# Patient Record
Sex: Female | Born: 1995 | Race: Black or African American | Hispanic: No | Marital: Single | Smoking: Never smoker
Health system: Southern US, Community
[De-identification: ages and names within clinical notes are randomized; demographics above are authoritative.]

## PROBLEM LIST (undated history)

## (undated) DIAGNOSIS — B009 Herpesviral infection, unspecified: Secondary | ICD-10-CM

## (undated) DIAGNOSIS — I10 Essential (primary) hypertension: Secondary | ICD-10-CM

## (undated) DIAGNOSIS — A64 Unspecified sexually transmitted disease: Secondary | ICD-10-CM

## (undated) HISTORY — PX: WISDOM TOOTH EXTRACTION: SHX21

## (undated) HISTORY — PX: TONSILLECTOMY: SUR1361

## (undated) HISTORY — DX: Herpesviral infection, unspecified: B00.9

## (undated) HISTORY — DX: Unspecified sexually transmitted disease: A64

## (undated) HISTORY — DX: Essential (primary) hypertension: I10

---

## 2013-08-08 ENCOUNTER — Encounter (HOSPITAL_COMMUNITY): Payer: Self-pay | Admitting: Emergency Medicine

## 2013-08-08 ENCOUNTER — Emergency Department (HOSPITAL_COMMUNITY)
Admission: EM | Admit: 2013-08-08 | Discharge: 2013-08-09 | Disposition: A | Discharge status: Home / Self Care | Payer: Medicaid Other | Attending: Emergency Medicine | Admitting: Emergency Medicine

## 2013-08-08 DIAGNOSIS — R102 Pelvic and perineal pain: Secondary | ICD-10-CM

## 2013-08-08 DIAGNOSIS — F17200 Nicotine dependence, unspecified, uncomplicated: Secondary | ICD-10-CM | POA: Insufficient documentation

## 2013-08-08 DIAGNOSIS — N949 Unspecified condition associated with female genital organs and menstrual cycle: Secondary | ICD-10-CM | POA: Insufficient documentation

## 2013-08-08 DIAGNOSIS — Z3202 Encounter for pregnancy test, result negative: Secondary | ICD-10-CM | POA: Insufficient documentation

## 2013-08-08 LAB — POCT PREGNANCY, URINE: Preg Test, Ur: NEGATIVE

## 2013-08-08 NOTE — ED Notes (Signed)
Pt states she woke up this am with severe abd pain she describes as cramps, she also states that she has a rash on her chest and a blister on her upper lip that she noticed a few days ago

## 2013-08-09 ENCOUNTER — Emergency Department (HOSPITAL_COMMUNITY): Payer: Medicaid Other

## 2013-08-09 LAB — COMPREHENSIVE METABOLIC PANEL
ALT: 12 U/L (ref 0–35)
Albumin: 4.1 g/dL (ref 3.5–5.2)
Alkaline Phosphatase: 66 U/L (ref 39–117)
BUN: 11 mg/dL (ref 6–23)
Chloride: 101 mEq/L (ref 96–112)
Creatinine, Ser: 0.76 mg/dL (ref 0.50–1.10)
GFR calc Af Amer: >90 mL/min (ref >90)
GFR calc non Af Amer: >90 mL/min (ref >90)
Glucose, Bld: 86 mg/dL (ref 70–99)
Potassium: 3.7 mEq/L (ref 3.5–5.1)
Sodium: 136 mEq/L (ref 135–145)
Total Bilirubin: 0.2 mg/dL — ABNORMAL LOW (ref 0.3–1.2)
Total Protein: 7.8 g/dL (ref 6.0–8.3)

## 2013-08-09 LAB — URINALYSIS, ROUTINE W REFLEX MICROSCOPIC
Bilirubin Urine: NEGATIVE
Hgb urine dipstick: NEGATIVE
Ketones, ur: NEGATIVE mg/dL
Leukocytes, UA: NEGATIVE
Nitrite: NEGATIVE
Protein, ur: 30 mg/dL — AB
Specific Gravity, Urine: 1.033 — ABNORMAL HIGH (ref 1.005–1.030)
pH: 5.5 (ref 5.0–8.0)

## 2013-08-09 LAB — CBC WITH DIFFERENTIAL/PLATELET
Eosinophils Absolute: 0.2 10*3/uL (ref 0.0–0.7)
Hemoglobin: 11.8 g/dL — ABNORMAL LOW (ref 12.0–15.0)
Lymphs Abs: 2.3 10*3/uL (ref 0.7–4.0)
MCH: 25.7 pg — ABNORMAL LOW (ref 26.0–34.0)
Monocytes Relative: 8 % (ref 3–12)
Neutro Abs: 7.1 10*3/uL (ref 1.7–7.7)
Neutrophils Relative %: 68 % (ref 43–77)
Platelets: 306 10*3/uL (ref 150–400)
RBC: 4.59 MIL/uL (ref 3.87–5.11)
RDW: 14.3 % (ref 11.5–15.5)
WBC: 10.4 10*3/uL (ref 4.0–10.5)

## 2013-08-09 LAB — WET PREP, GENITAL
Trich, Wet Prep: NONE SEEN
WBC, Wet Prep HPF POC: NONE SEEN
Yeast Wet Prep HPF POC: NONE SEEN

## 2013-08-09 LAB — LIPASE, BLOOD: Lipase: 29 U/L (ref 11–59)

## 2013-08-09 MED ORDER — OXYCODONE-ACETAMINOPHEN 5-325 MG PO TABS
2.0000 | ORAL_TABLET | Freq: Once | ORAL | Status: AC
  Administered 2013-08-09: 2 via ORAL
  Filled 2013-08-09: qty 2

## 2013-08-09 MED ORDER — DICYCLOMINE HCL 20 MG PO TABS
20.0000 mg | ORAL_TABLET | Freq: Once | ORAL | Status: AC
  Administered 2013-08-09: 20 mg via ORAL
  Filled 2013-08-09: qty 1

## 2013-08-09 MED ORDER — OXYCODONE-ACETAMINOPHEN 5-325 MG PO TABS: 2.0000 | ORAL_TABLET | ORAL | Status: DC | PRN

## 2013-08-09 MED ORDER — NAPROXEN 500 MG PO TABS: 500.0000 mg | ORAL_TABLET | Freq: Two times a day (BID) | ORAL | Status: DC

## 2013-08-09 NOTE — ED Provider Notes (Signed)
CSN: [id number]     Arrival date & time 08/08/13  2136 History   First MD Initiated Contact with Patient 08/08/13 2356     Chief Complaint  Patient presents with  . Abdominal Pain   (Consider location/radiation/quality/duration/timing/severity/associated sxs/prior Treatment) HPI 17 year old female presents to emergency room with severe lower abdominal pain described as cramping in nature.  She denies any fever, chills, no urinary symptoms, no new sexual partners.  No nausea or vomiting.  Her last menstrual period ended 3 days ago.  Patient had normal bowel movement this morning.  Patient took Midol and Gas-X.  This morning, which helped slightly.  She reports similar presentation of pain earlier in the year, also, right after her menses.  She's never had a pelvic exam before.  She is sexually active.  History reviewed. No pertinent past medical history. History reviewed. No pertinent past surgical history. History reviewed. No pertinent family history. History  Substance Use Topics  . Smoking status: Current Every Day Smoker  . Smokeless tobacco: Not on file  . Alcohol Use: Yes   OB History   Grav Para Term Preterm Abortions TAB SAB Ect Mult Living                 Review of Systems  See History of Present Illness; otherwise all other systems are reviewed and negative  Allergies  Review of patient's allergies indicates no known allergies.  Home Medications  No current outpatient prescriptions on file. BP 144/74  Pulse 68  Temp(Src) 98 F (36.7 C) (Oral)  Resp 16  SpO2 100%  LMP 08/06/2013 Physical Exam  Nursing note and vitals reviewed. Constitutional: She is oriented to person, place, and time. She appears well-developed and well-nourished.  HENT:  Head: Normocephalic and atraumatic.  Nose: Nose normal.  Mouth/Throat: Oropharynx is clear and moist.  Eyes: Conjunctivae and EOM are normal. Pupils are equal, round, and reactive to light.  Neck: Normal range of motion.  Neck supple. No JVD present. No tracheal deviation present. No thyromegaly present.  Cardiovascular: Normal rate, regular rhythm, normal heart sounds and intact distal pulses.  Exam reveals no gallop and no friction rub.   No murmur heard. Pulmonary/Chest: Effort normal and breath sounds normal. No stridor. No respiratory distress. She has no wheezes. She has no rales. She exhibits no tenderness.  Abdominal: Soft. Bowel sounds are normal. She exhibits no distension and no mass. There is tenderness (tenderness palpation throughout the lower abdomen/pelvic area). There is no rebound and no guarding.  Genitourinary:  External genitalia normal Vagina with slight white discharge Cervix closed/open friable no lesions No cervical motion tenderness Adnexa palpated, no masses , but bilateral adnexal tenderness, right greater than left Bladder palpated no tenderness Uterus palpated no masses or tenderness    Musculoskeletal: Normal range of motion. She exhibits no edema and no tenderness.  Lymphadenopathy:    She has no cervical adenopathy.  Neurological: She is alert and oriented to person, place, and time. She exhibits normal muscle tone. Coordination normal.  Skin: Skin is warm and dry. No rash noted. No erythema. No pallor.  Psychiatric: She has a normal mood and affect. Her behavior is normal. Judgment and thought content normal.    ED Course  Procedures (including critical care time) Labs Review Labs Reviewed  WET PREP, GENITAL - Abnormal; Notable for the following:    Clue Cells Wet Prep HPF POC MANY (*)    All other components within normal limits  CBC WITH DIFFERENTIAL - Abnormal;  Notable for the following:    Hemoglobin 11.8 (*)    MCH 25.7 (*)    All other components within normal limits  COMPREHENSIVE METABOLIC PANEL - Abnormal; Notable for the following:    Total Bilirubin 0.2 (*)    All other components within normal limits  URINALYSIS, ROUTINE W REFLEX MICROSCOPIC -  Abnormal; Notable for the following:    Specific Gravity, Urine 1.033 (*)    Protein, ur 30 (*)    All other components within normal limits  GC/CHLAMYDIA PROBE AMP  LIPASE, BLOOD  URINE MICROSCOPIC-ADD ON  POCT PREGNANCY, URINE   Imaging Review US Transvaginal Non-ob  08/09/2013   CLINICAL DATA:  Right lower quadrant abdominal pain.  EXAM: TRANSABDOMINAL AND TRANSVAGINAL ULTRASOUND OF PELVIS  DOPPLER ULTRASOUND OF OVARIES  TECHNIQUE: Both transabdominal and transvaginal ultrasound examinations of the pelvis were performed. Transabdominal technique was performed for global imaging of the pelvis including uterus, ovaries, adnexal regions, and pelvic cul-de-sac.  It was necessary to proceed with endovaginal exam following the transabdominal exam to visualize the uterus and ovaries in greater detail. Color and duplex Doppler ultrasound was utilized to evaluate blood flow to the ovaries.  COMPARISON:  None.  FINDINGS: Uterus  Measurements: 9.1 x 4.5 x 5.1 cm. No fibroids or other mass visualized.  Endometrium  Thickness: 0.1 cm in thickness.  No focal abnormality visualized.  Right ovary  Measurements: 3.6 x 1.8 x 1.7 cm. Normal appearance/no adnexal mass.  Left ovary  Measurements: 3.2 x 2.0 x 2.8 cm. Normal appearance/no adnexal mass.  Pulsed Doppler evaluation of both ovaries demonstrates normal low-resistance arterial and venous waveforms.  Other findings  Trace free fluid is seen within the pelvic cul-de-sac.  IMPRESSION: Unremarkable pelvic ultrasound.  No evidence for ovarian torsion.   Electronically Signed   By: Roanna Raider M.D.   On: 08/09/2013 03:04   US Pelvis Complete  08/09/2013   CLINICAL DATA:  Right lower quadrant abdominal pain.  EXAM: TRANSABDOMINAL AND TRANSVAGINAL ULTRASOUND OF PELVIS  DOPPLER ULTRASOUND OF OVARIES  TECHNIQUE: Both transabdominal and transvaginal ultrasound examinations of the pelvis were performed. Transabdominal technique was performed for global imaging of the  pelvis including uterus, ovaries, adnexal regions, and pelvic cul-de-sac.  It was necessary to proceed with endovaginal exam following the transabdominal exam to visualize the uterus and ovaries in greater detail. Color and duplex Doppler ultrasound was utilized to evaluate blood flow to the ovaries.  COMPARISON:  None.  FINDINGS: Uterus  Measurements: 9.1 x 4.5 x 5.1 cm. No fibroids or other mass visualized.  Endometrium  Thickness: 0.1 cm in thickness.  No focal abnormality visualized.  Right ovary  Measurements: 3.6 x 1.8 x 1.7 cm. Normal appearance/no adnexal mass.  Left ovary  Measurements: 3.2 x 2.0 x 2.8 cm. Normal appearance/no adnexal mass.  Pulsed Doppler evaluation of both ovaries demonstrates normal low-resistance arterial and venous waveforms.  Other findings  Trace free fluid is seen within the pelvic cul-de-sac.  IMPRESSION: Unremarkable pelvic ultrasound.  No evidence for ovarian torsion.   Electronically Signed   By: Roanna Raider M.D.   On: 08/09/2013 03:04   [place] Art/ven Flow Abd Pelv Doppler  08/09/2013   CLINICAL DATA:  Right lower quadrant abdominal pain.  EXAM: TRANSABDOMINAL AND TRANSVAGINAL ULTRASOUND OF PELVIS  DOPPLER ULTRASOUND OF OVARIES  TECHNIQUE: Both transabdominal and transvaginal ultrasound examinations of the pelvis were performed. Transabdominal technique was performed for global imaging of the pelvis including uterus, ovaries, adnexal regions, and pelvic cul-de-sac.  It was necessary to proceed with endovaginal exam following the transabdominal exam to visualize the uterus and ovaries in greater detail. Color and duplex Doppler ultrasound was utilized to evaluate blood flow to the ovaries.  COMPARISON:  None.  FINDINGS: Uterus  Measurements: 9.1 x 4.5 x 5.1 cm. No fibroids or other mass visualized.  Endometrium  Thickness: 0.1 cm in thickness.  No focal abnormality visualized.  Right ovary  Measurements: 3.6 x 1.8 x 1.7 cm. Normal appearance/no adnexal mass.  Left ovary   Measurements: 3.2 x 2.0 x 2.8 cm. Normal appearance/no adnexal mass.  Pulsed Doppler evaluation of both ovaries demonstrates normal low-resistance arterial and venous waveforms.  Other findings  Trace free fluid is seen within the pelvic cul-de-sac.  IMPRESSION: Unremarkable pelvic ultrasound.  No evidence for ovarian torsion.   Electronically Signed   By: Roanna Raider M.D.   On: 08/09/2013 03:04     MDM   1. Pelvic pain    17 year old female with lower abdominal/pelvic pain since this morning.  Her lab work is unremarkable.  Physical exam shows tenderness to her right adnexa.  Concern for possible torsion versus ovarian cyst.  We'll get ultrasound.  Ultrasound without specific findings.  Patient is feeling better after pain medication.  We'll refer her to women's clinic for further workup.    Olivia Mackie, MD 08/09/13 [phone]

## 2013-08-10 LAB — GC/CHLAMYDIA PROBE AMP
CT Probe RNA: NEGATIVE
GC Probe RNA: POSITIVE — AB

## 2013-08-11 ENCOUNTER — Telehealth (HOSPITAL_COMMUNITY): Payer: Self-pay | Admitting: Emergency Medicine

## 2013-08-11 NOTE — ED Notes (Signed)
Patient has +Gonorrhea. °

## 2013-08-11 NOTE — ED Notes (Signed)
+  Gonorrhea. Chart sent to EDP office for review. [employer]HS attached. °

## 2014-04-22 ENCOUNTER — Emergency Department (HOSPITAL_COMMUNITY)
Admission: EM | Admit: 2014-04-22 | Discharge: 2014-04-23 | Disposition: A | Discharge status: Home / Self Care | Payer: Medicaid Other | Attending: Emergency Medicine | Admitting: Emergency Medicine

## 2014-04-22 ENCOUNTER — Encounter (HOSPITAL_COMMUNITY): Payer: Self-pay | Admitting: Emergency Medicine

## 2014-04-22 ENCOUNTER — Emergency Department (HOSPITAL_COMMUNITY): Payer: Medicaid Other

## 2014-04-22 DIAGNOSIS — O200 Threatened abortion: Secondary | ICD-10-CM

## 2014-04-22 DIAGNOSIS — O30009 Twin pregnancy, unspecified number of placenta and unspecified number of amniotic sacs, unspecified trimester: Secondary | ICD-10-CM | POA: Insufficient documentation

## 2014-04-22 DIAGNOSIS — O30001 Twin pregnancy, unspecified number of placenta and unspecified number of amniotic sacs, first trimester: Secondary | ICD-10-CM

## 2014-04-22 DIAGNOSIS — O99330 Smoking (tobacco) complicating pregnancy, unspecified trimester: Secondary | ICD-10-CM | POA: Insufficient documentation

## 2014-04-22 LAB — CBC WITH DIFFERENTIAL/PLATELET
Basophils Absolute: 0 10*3/uL (ref 0.0–0.1)
Basophils Relative: 0 % (ref 0–1)
Eosinophils Absolute: 0.1 10*3/uL (ref 0.0–0.7)
Eosinophils Relative: 1 % (ref 0–5)
HCT: 33.4 % — ABNORMAL LOW (ref 36.0–46.0)
HEMOGLOBIN: 11.1 g/dL — AB (ref 12.0–15.0)
LYMPHS ABS: 2.6 10*3/uL (ref 0.7–4.0)
LYMPHS PCT: 18 % (ref 12–46)
MCH: 26.7 pg (ref 26.0–34.0)
MCHC: 33.2 g/dL (ref 30.0–36.0)
MCV: 80.3 fL (ref 78.0–100.0)
Monocytes Absolute: 0.9 10*3/uL (ref 0.1–1.0)
Monocytes Relative: 6 % (ref 3–12)
NEUTROS ABS: 11.4 10*3/uL — AB (ref 1.7–7.7)
NEUTROS PCT: 75 % (ref 43–77)
PLATELETS: 274 10*3/uL (ref 150–400)
RBC: 4.16 MIL/uL (ref 3.87–5.11)
RDW: 14.4 % (ref 11.5–15.5)
WBC: 15 10*3/uL — AB (ref 4.0–10.5)

## 2014-04-22 LAB — COMPREHENSIVE METABOLIC PANEL
ALK PHOS: 49 U/L (ref 39–117)
ALT: 7 U/L (ref 0–35)
ANION GAP: 13 (ref 5–15)
AST: 9 U/L (ref 0–37)
Albumin: 3.7 g/dL (ref 3.5–5.2)
BILIRUBIN TOTAL: 0.3 mg/dL (ref 0.3–1.2)
BUN: 5 mg/dL — AB (ref 6–23)
CHLORIDE: 100 meq/L (ref 96–112)
CO2: 22 mEq/L (ref 19–32)
Calcium: 9.2 mg/dL (ref 8.4–10.5)
Creatinine, Ser: 0.58 mg/dL (ref 0.50–1.10)
GFR calc Af Amer: >90 mL/min (ref >90)
GFR calc non Af Amer: >90 mL/min (ref >90)
Glucose, Bld: 84 mg/dL (ref 70–99)
POTASSIUM: 3.8 meq/L (ref 3.7–5.3)
SODIUM: 135 meq/L — AB (ref 137–147)
Total Protein: 6.9 g/dL (ref 6.0–8.3)

## 2014-04-22 LAB — HCG, QUANTITATIVE, PREGNANCY: hCG, Beta Chain, Quant, S: 87565 m[IU]/mL — ABNORMAL HIGH (ref <5)

## 2014-04-22 MED ORDER — SODIUM CHLORIDE 0.9 % IV SOLN
1000.0000 mL | Freq: Once | INTRAVENOUS | Status: AC
  Administered 2014-04-22: 1000 mL via INTRAVENOUS

## 2014-04-22 MED ORDER — ONDANSETRON HCL 4 MG/2ML IJ SOLN
4.0000 mg | Freq: Once | INTRAMUSCULAR | Status: AC
  Administered 2014-04-22: 4 mg via INTRAVENOUS
  Filled 2014-04-22: qty 2

## 2014-04-22 MED ORDER — SODIUM CHLORIDE 0.9 % IV SOLN: 1000.0000 mL | INTRAVENOUS | Status: DC

## 2014-04-22 NOTE — ED Notes (Signed)
Pt at U/S

## 2014-04-22 NOTE — ED Notes (Signed)
Pt reminded that she still needs to provide urine sample, states she needs a few minutes before going

## 2014-04-22 NOTE — ED Notes (Signed)
PT placed in gown and in bed. Pt monitored by pulse ox, bp cuff, and 5-lead. 

## 2014-04-22 NOTE — ED Provider Notes (Signed)
CSN: [id number]     Arrival date & time 04/22/14  2202 History   First MD Initiated Contact with Patient 04/22/14 2221     Chief Complaint  Patient presents with  . Abdominal Pain    HPI Pt is G1P0, LMP June 1st.  Positive home pregnancy test. This morning with severe bilateral lower abdominal cramping.  No bleeding.  No fever.  No vomiting or diarrhea. Symptoms have persisted through the day.  No prenatal care yet.  History reviewed. No pertinent past medical history. Past Surgical History  Procedure Laterality Date  . Tonsillectomy     No family history on file. History  Substance Use Topics  . Smoking status: Current Every Day Smoker  . Smokeless tobacco: Not on file  . Alcohol Use: Yes   OB History   Grav Para Term Preterm Abortions TAB SAB Ect Mult Living                 Review of Systems  All other systems reviewed and are negative.     Allergies  Review of patient's allergies indicates no known allergies.  Home Medications   Prior to Admission medications   Not on File   BP 139/50  Pulse 70  Temp(Src) 98.2 F (36.8 C) (Oral)  Resp 21  Ht 5\' 7"  (1.702 m)  Wt 270 lb (122.471 kg)  BMI 42.28 kg/m2  SpO2 100%  LMP 02/25/2014 Physical Exam  Nursing note and vitals reviewed. Constitutional: She appears well-developed and well-nourished. No distress.  HENT:  Head: Normocephalic and atraumatic.  Right Ear: External ear normal.  Left Ear: External ear normal.  Eyes: Conjunctivae are normal. Right eye exhibits no discharge. Left eye exhibits no discharge. No scleral icterus.  Neck: Neck supple. No tracheal deviation present.  Cardiovascular: Normal rate, regular rhythm and intact distal pulses.   Pulmonary/Chest: Effort normal and breath sounds normal. No stridor. No respiratory distress. She has no wheezes. She has no rales.  Abdominal: Soft. Bowel sounds are normal. She exhibits no distension. There is tenderness in the suprapubic area. There is no  rigidity, no rebound and no guarding.  Genitourinary: There is no rash on the right labia. There is no rash on the left labia. Uterus is not tender. Cervix exhibits no motion tenderness and no discharge. Right adnexum displays no mass and no tenderness. Left adnexum displays no mass and no tenderness. No tenderness or bleeding around the vagina. No signs of injury around the vagina. No vaginal discharge found.  Musculoskeletal: She exhibits no edema and no tenderness.  Neurological: She is alert. She has normal strength. No cranial nerve deficit (no facial droop, extraocular movements intact, no slurred speech) or sensory deficit. She exhibits normal muscle tone. She displays no seizure activity. Coordination normal.  Skin: Skin is warm and dry. No rash noted.  Psychiatric: She has a normal mood and affect.    ED Course  Procedures (including critical care time) Labs Review Labs Reviewed  CBC WITH DIFFERENTIAL - Abnormal; Notable for the following:    WBC 15.0 (*)    Hemoglobin 11.1 (*)    HCT 33.4 (*)    Neutro Abs 11.4 (*)    All other components within normal limits  COMPREHENSIVE METABOLIC PANEL - Abnormal; Notable for the following:    Sodium 135 (*)    BUN 5 (*)    All other components within normal limits  HCG, QUANTITATIVE, PREGNANCY - Abnormal; Notable for the following:    hCG, Beta  [place], [place] [address] (*)    All other components within normal limits  GC/CHLAMYDIA PROBE AMP  URINALYSIS, ROUTINE W REFLEX MICROSCOPIC  RPR  HIV ANTIBODY (ROUTINE TESTING)  POC URINE PREG, ED    Imaging Review US Ob Comp Less 14 Wks  04/22/2014   CLINICAL DATA:  Pelvic pain. Estimated gestational age by LMP is 8 weeks 0 days. Quantitative beta HCG is 87,565  EXAM: TWIN OBSTETRIC <14WK [place] AND TRANSVAGINAL OB US  COMPARISON:  None.  FINDINGS: TWIN A  Intrauterine gestational sac: Localized on the maternal right. Normal in shape.  Yolk sac:  Present  Embryo:  Present  Cardiac Activity: Observed.   Heart Rate: 147 bpm  CRL:  18.3  mm   8 w 2 d                  [place] EDC: 11/30/2014  TWIN B  Intrauterine gestational sac: Localized on the maternal left. Normal in shape.  Yolk sac:  Present  Embryo:  Present  Cardiac Activity: Observed.  Heart Rate: 168 bpm  CRL:  18.3  mm   8 w 2 d                  [place] EDC: 11/30/2014  Maternal uterus/adnexae: Circumscribed lesion demonstrated in the right fundal myometrium measuring 2.9 cm maximal diameter consistent with fibroid. No subchorionic hemorrhage demonstrated. Both ovaries are visualized and appear normal. Corpus luteum cyst is noted on the left ovary. No free pelvic fluid collections.  IMPRESSION: Twin intrauterine pregnancy. Estimated gestational age by crown-rump length is 8 weeks 2 days for twin a and 8 weeks 2 days for twin B. A uterine fibroid is noted.   Electronically Signed   By: Lucienne Capers M.D.   On: 04/22/2014 23:55   US Ob Comp Addl Gest Less 14 Wks  04/22/2014   CLINICAL DATA:  Pelvic pain. Estimated gestational age by LMP is 8 weeks 0 days. Quantitative beta HCG is 87,565  EXAM: TWIN OBSTETRIC <14WK [place] AND TRANSVAGINAL OB US  COMPARISON:  None.  FINDINGS: TWIN A  Intrauterine gestational sac: Localized on the maternal right. Normal in shape.  Yolk sac:  Present  Embryo:  Present  Cardiac Activity: Observed.  Heart Rate: 147 bpm  CRL:  18.3  mm   8 w 2 d                  [place] EDC: 11/30/2014  TWIN B  Intrauterine gestational sac: Localized on the maternal left. Normal in shape.  Yolk sac:  Present  Embryo:  Present  Cardiac Activity: Observed.  Heart Rate: 168 bpm  CRL:  18.3  mm   8 w 2 d                  [place] EDC: 11/30/2014  Maternal uterus/adnexae: Circumscribed lesion demonstrated in the right fundal myometrium measuring 2.9 cm maximal diameter consistent with fibroid. No subchorionic hemorrhage demonstrated. Both ovaries are visualized and appear normal. Corpus luteum cyst is noted on the left ovary. No free pelvic fluid collections.  IMPRESSION:  Twin intrauterine pregnancy. Estimated gestational age by crown-rump length is 8 weeks 2 days for twin a and 8 weeks 2 days for twin B. A uterine fibroid is noted.   Electronically Signed   By: Lucienne Capers M.D.   On: 04/22/2014 23:55   US Ob Transvaginal  04/22/2014   CLINICAL DATA:  Pelvic pain. Estimated gestational age by LMP  is 8 weeks 0 days. Quantitative beta HCG is 87,565  EXAM: TWIN OBSTETRIC <14WK [place] AND TRANSVAGINAL OB US  COMPARISON:  None.  FINDINGS: TWIN A  Intrauterine gestational sac: Localized on the maternal right. Normal in shape.  Yolk sac:  Present  Embryo:  Present  Cardiac Activity: Observed.  Heart Rate: 147 bpm  CRL:  18.3  mm   8 w 2 d                  [place] EDC: 11/30/2014  TWIN B  Intrauterine gestational sac: Localized on the maternal left. Normal in shape.  Yolk sac:  Present  Embryo:  Present  Cardiac Activity: Observed.  Heart Rate: 168 bpm  CRL:  18.3  mm   8 w 2 d                  [place] EDC: 11/30/2014  Maternal uterus/adnexae: Circumscribed lesion demonstrated in the right fundal myometrium measuring 2.9 cm maximal diameter consistent with fibroid. No subchorionic hemorrhage demonstrated. Both ovaries are visualized and appear normal. Corpus luteum cyst is noted on the left ovary. No free pelvic fluid collections.  IMPRESSION: Twin intrauterine pregnancy. Estimated gestational age by crown-rump length is 8 weeks 2 days for twin a and 8 weeks 2 days for twin B. A uterine fibroid is noted.   Electronically Signed   By: Lucienne Capers M.D.   On: 04/22/2014 23:55     MDM   Final diagnoses:  Threatened miscarriage  Twin gestation in first trimester, unspecified placenta and amniotic sac number    Threatened miscarriage but [place] is reassuring.  Doubt appendicitis based on exam.   DC home.  Tylenol for pain.  Follow up with ob gyn   Dorie Rank, MD 04/23/14 [phone]

## 2014-04-22 NOTE — ED Notes (Signed)
Pt. reports low abdominal cramping onset this morning , denies nausea or vomitting / no diarrhea , no fever or chills, pt. stated positive home pregnancy test LMP- 02/25/2014 , denies vaginal bleeding or discharge.

## 2014-04-23 LAB — URINALYSIS, ROUTINE W REFLEX MICROSCOPIC
BILIRUBIN URINE: NEGATIVE
Glucose, UA: NEGATIVE mg/dL
HGB URINE DIPSTICK: NEGATIVE
KETONES UR: NEGATIVE mg/dL
NITRITE: NEGATIVE
Protein, ur: NEGATIVE mg/dL
SPECIFIC GRAVITY, URINE: 1.007 (ref 1.005–1.030)
Urobilinogen, UA: 0.2 mg/dL (ref 0.0–1.0)
pH: 6.5 (ref 5.0–8.0)

## 2014-04-23 LAB — URINE MICROSCOPIC-ADD ON

## 2014-04-23 LAB — RPR

## 2014-04-23 LAB — HIV ANTIBODY (ROUTINE TESTING W REFLEX): HIV: NONREACTIVE

## 2014-04-23 LAB — POC URINE PREG, ED: Preg Test, Ur: POSITIVE — AB

## 2014-04-23 NOTE — Discharge Instructions (Signed)
Multiple Pregnancy A multiple pregnancy is when a woman is pregnant with two or more fetuses. Multiple pregnancies occur in about 3% of all births. The more babies in a pregnancy, the greater the risks of problems to the babies and mother. This includes death. Since the use of Assisted Reproductive Technology (ART) and medications that can induce ovulation, multiple fetal gestation has increased.  RISKS TO THE MOTHER  Preeclampsia and eclampsia.  Postpartum bleeding (hemorrhage).  Kidney infection (pyelonephritis).  Develop anemia.  Develop diabetes.  Liver complications.  A blood clot blocks the artery, or branch of the artery leading to the lungs (pulmonary embolism).  Blood clots in the leg.  Placental separation.  Higher rate of Cesarean Section deliveries.  Women over 35 years old have a higher rate of Downs Syndrome babies. RISKS TO THE BABY  Preterm labor with a premature baby.  Very low birth weight babies that are less than 3 pounds, especially with triplets or mores.  Premature rupture of the membranes.  Twin to twin blood transfusion with one baby anemic and the other baby with too much blood in its system. There may also be heart failure.  With triplets or more, one of the babies is at high risk for cerebral palsy or other neurologic problem.  There is a higher incidence of fetal death. CARE OF MOTHERS WITH MULTIPLE FETAL GESTATION Multiple pregnancies need more care and special prenatal care.  You will see your caregiver more often.  You will have more tests including ultrasounds, nonstress tests and blood tests.  You will have special tests done called amniocentesis and a biophysical profile.  You may be hospitalized more often during the pregnancy.  You will be encouraged to eat a balanced and healthy diet with vitamin and mineral supplements as directed.  You will be asked to get more rest and sleep to keep up your energy.  You will be asked to  restrict your daily activities, exercise, work, household chores and sexual activity.  If you have preterm labor with small babies, you will be given a steroid injection to help the babies lungs mature and do better when born.  The delivery may have to be by Cesarean delivery, especially if there are triplets or more.  The delivery should be in a hospital with an intensive care nursery and Neonatologists (pediatrician for high risk babies) to care for the newborn babies. HOME CARE INSTRUCTIONS   Follow the caregiver's recommendations regarding office visits, tests for you and the babies, diet, rest and medications.  Avoid a large amount of physical activity.  Arrange to have help after the babies are born and when you go home from the hospital.  Take classes on how to care for multiple babies before you deliver them. SEEK IMMEDIATE MEDICAL CARE IF:   You develop a temperature of 102 F (38.9 C) or higher.  You are leaking fluid from the vagina.  You develop vaginal bleeding.  You develop uterine contractions.  You develop a severe headache, severe upper abdominal pain, visual problems or excessive swelling of your face, hands and feet.  You develop severe back pain or leg pain.  You develop severe tiredness.  You develop chest pain.  You have shortness of breath, fall down or pass out. Document Released: 06/22/2008 Document Revised: 12/06/2011 Document Reviewed: 08/17/2013 Exit[employer] Patient Information 2015 [employer]. This information is not intended to replace advice given to you by your health care provider. Make sure you discuss any questions you have with   your health care provider.

## 2014-04-24 LAB — GC/CHLAMYDIA PROBE AMP
CT PROBE, AMP APTIMA: NEGATIVE
GC PROBE AMP APTIMA: NEGATIVE

## 2014-07-15 ENCOUNTER — Emergency Department (HOSPITAL_COMMUNITY)
Admission: EM | Admit: 2014-07-15 | Discharge: 2014-07-15 | Disposition: A | Discharge status: Home / Self Care | Payer: Medicaid Other | Attending: Emergency Medicine | Admitting: Emergency Medicine

## 2014-07-15 ENCOUNTER — Encounter (HOSPITAL_COMMUNITY): Payer: Self-pay | Admitting: Emergency Medicine

## 2014-07-15 DIAGNOSIS — N760 Acute vaginitis: Secondary | ICD-10-CM

## 2014-07-15 DIAGNOSIS — B9689 Other specified bacterial agents as the cause of diseases classified elsewhere: Secondary | ICD-10-CM

## 2014-07-15 DIAGNOSIS — O99330 Smoking (tobacco) complicating pregnancy, unspecified trimester: Secondary | ICD-10-CM | POA: Insufficient documentation

## 2014-07-15 DIAGNOSIS — N390 Urinary tract infection, site not specified: Secondary | ICD-10-CM

## 2014-07-15 DIAGNOSIS — F17200 Nicotine dependence, unspecified, uncomplicated: Secondary | ICD-10-CM | POA: Insufficient documentation

## 2014-07-15 DIAGNOSIS — Z79899 Other long term (current) drug therapy: Secondary | ICD-10-CM | POA: Diagnosis not present

## 2014-07-15 DIAGNOSIS — O2342 Unspecified infection of urinary tract in pregnancy, second trimester: Secondary | ICD-10-CM | POA: Diagnosis not present

## 2014-07-15 DIAGNOSIS — O2392 Unspecified genitourinary tract infection in pregnancy, second trimester: Secondary | ICD-10-CM | POA: Diagnosis present

## 2014-07-15 DIAGNOSIS — Z3A20 20 weeks gestation of pregnancy: Secondary | ICD-10-CM | POA: Insufficient documentation

## 2014-07-15 LAB — URINALYSIS, ROUTINE W REFLEX MICROSCOPIC
Bilirubin Urine: NEGATIVE
Glucose, UA: NEGATIVE mg/dL
Hgb urine dipstick: NEGATIVE
Ketones, ur: NEGATIVE mg/dL
Nitrite: NEGATIVE
Protein, ur: NEGATIVE mg/dL
Specific Gravity, Urine: 1.015 (ref 1.005–1.030)
UROBILINOGEN UA: 0.2 mg/dL (ref 0.0–1.0)
pH: 7.5 (ref 5.0–8.0)

## 2014-07-15 LAB — RPR

## 2014-07-15 LAB — URINE MICROSCOPIC-ADD ON

## 2014-07-15 LAB — WET PREP, GENITAL
Trich, Wet Prep: NONE SEEN
WBC, Wet Prep HPF POC: NONE SEEN

## 2014-07-15 MED ORDER — METRONIDAZOLE 500 MG PO TABS: 500.0000 mg | ORAL_TABLET | Freq: Two times a day (BID) | ORAL | Status: DC

## 2014-07-15 MED ORDER — CEPHALEXIN 500 MG PO CAPS: 500.0000 mg | ORAL_CAPSULE | Freq: Two times a day (BID) | ORAL | Status: DC

## 2014-07-15 NOTE — Progress Notes (Signed)
Spoke to Dr Charolette Child on the phone. Pt has been cleared Obstetrically. Reviewed signs of preterm labor and ROM. Also reviewed infant movement at this gestation. Pt to keep scheduled appointment.

## 2014-07-15 NOTE — ED Notes (Signed)
Pt reports she is having vaginal itching and irritation for past few days. Small amount of discharge. Pt reports she has just turned [redacted] weeks pregnant. No issues with the pregnancy.

## 2014-07-15 NOTE — ED Provider Notes (Signed)
CSN: [id number]     Arrival date & time 07/15/14  1327 History   First MD Initiated Contact with Patient 07/15/14 1408     Chief Complaint  Patient presents with  . Vaginal Discharge     (Consider location/radiation/quality/duration/timing/severity/associated sxs/prior Treatment) HPI  This is a 18 year old G67P0 female at approximately [redacted] weeks pregnant with twins who presents with vaginal discharge and itching. Patient reports that she thinks she may have a yeast infection.  Denies any new sexual partners or concerns for STDs. Denies any vaginal bleeding, abdominal cramping. She is followed by Dr. Marvel Plan for her OB care. Patient has not tried anything over-the-counter for her symptoms. She denies any dysuria.    History reviewed. No pertinent past medical history. Past Surgical History  Procedure Laterality Date  . Tonsillectomy     History reviewed. No pertinent family history. History  Substance Use Topics  . Smoking status: Current Every Day Smoker  . Smokeless tobacco: Not on file  . Alcohol Use: Yes   OB History   Grav Para Term Preterm Abortions TAB SAB Ect Mult Living   1              Review of Systems  Gastrointestinal: Negative for abdominal pain.  Genitourinary: Positive for vaginal discharge. Negative for dysuria, vaginal bleeding, vaginal pain and pelvic pain.  All other systems reviewed and are negative.     Allergies  Review of patient's allergies indicates no known allergies.  Home Medications   Prior to Admission medications   Medication Sig Start Date End Date Taking? Authorizing Provider  Prenatal Vit-Min-FA-Fish Oil ([facility] PRENATAL GUMMY PO) Take 2 tablets by mouth daily.   Yes Historical Provider, MD  cephALEXin (KEFLEX) 500 MG capsule Take 1 capsule (500 mg total) by mouth 2 (two) times daily. 07/15/14   Merryl Hacker, MD  metroNIDAZOLE (FLAGYL) 500 MG tablet Take 1 tablet (500 mg total) by mouth 2 (two) times daily. 07/15/14   Merryl Hacker, MD   BP 142/77  Pulse 78  Temp(Src) 98.1 F (36.7 C) (Oral)  Resp 19  SpO2 99%  LMP 03/27/2014 Physical Exam  Nursing note and vitals reviewed. Constitutional: She is oriented to person, place, and time. She appears well-developed and well-nourished.  HENT:  Head: Normocephalic and atraumatic.  Cardiovascular: Normal rate, regular rhythm and normal heart sounds.   Pulmonary/Chest: Effort normal and breath sounds normal. No respiratory distress. She has no wheezes.  Abdominal: Soft. Bowel sounds are normal. There is no tenderness. There is no rebound.  Gravid just above the umbilicus  Genitourinary: Vaginal discharge found.  Scant vaginal discharge, os closed, no vaginal bleeding noted, no adnexal tenderness  Neurological: She is alert and oriented to person, place, and time.  Skin: Skin is warm and dry.  Psychiatric: She has a normal mood and affect.    ED Course  Procedures (including critical care time)  EMERGENCY DEPARTMENT [place] PREGNANCY "Study: Limited Ultrasound of the Pelvis"  INDICATIONS:Pregnancy(required) Multiple views of the uterus and pelvic cavity are obtained with a multi-frequency probe.  APPROACH:Transabdominal   PERFORMED BY: Myself  IMAGES ARCHIVED?: Yes  LIMITATIONS: Emergent procedure  PREGNANCY FREE FLUID: None   PREGNANCY FINDINGS: Multiple pregancy noted  INTERPRETATION: Viable intrauterine pregnancy  GESTATIONAL AGE, ESTIMATE: Corresponds to ~20 weeks  FETAL HEART RATE: #1 - 154, #2 - 146  COMMENT(Estimate of Gestational Age)   Labs Review Labs Reviewed  WET PREP, GENITAL - Abnormal; Notable for the following:  Yeast Wet Prep HPF POC RARE (*)    Clue Cells Wet Prep HPF POC FEW (*)    All other components within normal limits  URINALYSIS, ROUTINE W REFLEX MICROSCOPIC - Abnormal; Notable for the following:    APPearance CLOUDY (*)    Leukocytes, UA MODERATE (*)    All other components within normal limits  URINE  MICROSCOPIC-ADD ON - Abnormal; Notable for the following:    Squamous Epithelial / LPF FEW (*)    Bacteria, UA FEW (*)    All other components within normal limits  GC/CHLAMYDIA PROBE AMP  RPR  HIV ANTIBODY (ROUTINE TESTING)    Imaging Review No results found.   EKG Interpretation None      MDM   Final diagnoses:  UTI (lower urinary tract infection)  Bacterial vaginosis   Patient presents with vaginal itching and discharge. Pelvic exam is largely unremarkable. Wet prep with few clue cells and rare yeast.  Patient also with 11-20 white cells and few bacteria in her urine. Patient given Keflex and metronidazole. Other STD testing pending. She will followup with her OB. Fetal heart tones at the bedside reassuring.  After history, exam, and medical workup I feel the patient has been appropriately medically screened and is safe for discharge home. Pertinent diagnoses were discussed with the patient. Patient was given return precautions.     Merryl Hacker, MD 07/16/14 [phone]

## 2014-07-15 NOTE — Progress Notes (Signed)
Pt is a 18 years old G1P0 [redacted] weeks gestation with twins. Reports feeling vag itching since Friday with no discharge on underclothing. Both babies are active. FH with doppler has Baby A at 148bpm and Baby B at 152bpm. No uterine contractions. No vag bleeding. Pt only takes prenatal vitamins. Pt has scheduled OB appointment 08/02/2015.

## 2014-07-15 NOTE — Discharge Instructions (Signed)
Bacterial Vaginosis °Bacterial vaginosis is a vaginal infection that occurs when the normal balance of bacteria in the vagina is disrupted. It results from an overgrowth of certain bacteria. This is the most common vaginal infection in women of childbearing age. Treatment is important to prevent complications, especially in pregnant women, as it can cause a premature delivery. °CAUSES  °Bacterial vaginosis is caused by an increase in harmful bacteria that are normally present in smaller amounts in the vagina. Several different kinds of bacteria can cause bacterial vaginosis. However, the reason that the condition develops is not fully understood. °RISK FACTORS °Certain activities or behaviors can put you at an increased risk of developing bacterial vaginosis, including: °· Having a new sex partner or multiple sex partners. °· Douching. °· Using an intrauterine device (IUD) for contraception. °Women do not get bacterial vaginosis from toilet seats, bedding, swimming pools, or contact with objects around them. °SIGNS AND SYMPTOMS  °Some women with bacterial vaginosis have no signs or symptoms. Common symptoms include: °· Grey vaginal discharge. °· A fishlike odor with discharge, especially after sexual intercourse. °· Itching or burning of the vagina and vulva. °· Burning or pain with urination. °DIAGNOSIS  °Your health care provider will take a medical history and examine the vagina for signs of bacterial vaginosis. A sample of vaginal fluid may be taken. Your health care provider will look at this sample under a microscope to check for bacteria and abnormal cells. A vaginal pH test may also be done.  °TREATMENT  °Bacterial vaginosis may be treated with antibiotic medicines. These may be given in the form of a pill or a vaginal cream. A second round of antibiotics may be prescribed if the condition comes back after treatment.  °HOME CARE INSTRUCTIONS  °· Only take over-the-counter or prescription medicines as  directed by your health care provider. °· If antibiotic medicine was prescribed, take it as directed. Make sure you finish it even if you start to feel better. °· Do not have sex until treatment is completed. °· Tell all sexual partners that you have a vaginal infection. They should see their health care provider and be treated if they have problems, such as a mild rash or itching. °· Practice safe sex by using condoms and only having one sex partner. °SEEK MEDICAL CARE IF:  °· Your symptoms are not improving after 3 days of treatment. °· You have increased discharge or pain. °· You have a fever. °MAKE SURE YOU:  °· Understand these instructions. °· Will watch your condition. °· Will get help right away if you are not doing well or get worse. °FOR MORE INFORMATION  °Centers for Disease Control and Prevention, Division of STD Prevention: www.cdc.gov/std °American Sexual Health Association (ASHA): www.ashastd.org  °Document Released: 09/13/2005 Document Revised: 07/04/2013 Document Reviewed: 04/25/2013 °ExitCare® Patient Information ©2015 [employer]. This information is not intended to replace advice given to you by your health care provider. Make sure you discuss any questions you have with your health care provider. °Urinary Tract Infection °Urinary tract infections (UTIs) can develop anywhere along your urinary tract. Your urinary tract is your body's drainage system for removing wastes and extra water. Your urinary tract includes two kidneys, two ureters, a bladder, and a urethra. Your kidneys are a pair of bean-shaped organs. Each kidney is about the size of your fist. They are located below your ribs, one on each side of your spine. °CAUSES °Infections are caused by microbes, which are microscopic organisms, including fungi, viruses, and   bacteria. These organisms are so small that they can only be seen through a microscope. Bacteria are the microbes that most commonly cause UTIs. °SYMPTOMS  °Symptoms of UTIs  may vary by age and gender of the patient and by the location of the infection. Symptoms in young women typically include a frequent and intense urge to urinate and a painful, burning feeling in the bladder or urethra during urination. Older women and men are more likely to be tired, shaky, and weak and have muscle aches and abdominal pain. A fever may mean the infection is in your kidneys. Other symptoms of a kidney infection include pain in your back or sides below the ribs, nausea, and vomiting. °DIAGNOSIS °To diagnose a UTI, your caregiver will ask you about your symptoms. Your caregiver also will ask to provide a urine sample. The urine sample will be tested for bacteria and white blood cells. White blood cells are made by your body to help fight infection. °TREATMENT  °Typically, UTIs can be treated with medication. Because most UTIs are caused by a bacterial infection, they usually can be treated with the use of antibiotics. The choice of antibiotic and length of treatment depend on your symptoms and the type of bacteria causing your infection. °HOME CARE INSTRUCTIONS °· If you were prescribed antibiotics, take them exactly as your caregiver instructs you. Finish the medication even if you feel better after you have only taken some of the medication. °· Drink enough water and fluids to keep your urine clear or pale yellow. °· Avoid caffeine, tea, and carbonated beverages. They tend to irritate your bladder. °· Empty your bladder often. Avoid holding urine for long periods of time. °· Empty your bladder before and after sexual intercourse. °· After a bowel movement, women should cleanse from front to back. Use each tissue only once. °SEEK MEDICAL CARE IF:  °· You have back pain. °· You develop a fever. °· Your symptoms do not begin to resolve within 3 days. °SEEK IMMEDIATE MEDICAL CARE IF:  °· You have severe back pain or lower abdominal pain. °· You develop chills. °· You have nausea or vomiting. °· You have  continued burning or discomfort with urination. °MAKE SURE YOU:  °· Understand these instructions. °· Will watch your condition. °· Will get help right away if you are not doing well or get worse. °Document Released: 06/23/2005 Document Revised: 03/14/2012 Document Reviewed: 10/22/2011 °ExitCare® Patient Information ©2015 [employer]. This information is not intended to replace advice given to you by your health care provider. Make sure you discuss any questions you have with your health care provider. ° °

## 2014-07-16 LAB — GC/CHLAMYDIA PROBE AMP
CT Probe RNA: NEGATIVE
GC Probe RNA: NEGATIVE

## 2014-07-16 LAB — HIV ANTIBODY (ROUTINE TESTING W REFLEX): HIV 1&2 Ab, 4th Generation: NONREACTIVE

## 2014-07-22 ENCOUNTER — Emergency Department (HOSPITAL_COMMUNITY): Payer: Medicaid Other

## 2014-07-22 ENCOUNTER — Emergency Department (HOSPITAL_COMMUNITY)
Admission: EM | Admit: 2014-07-22 | Discharge: 2014-07-22 | Disposition: A | Discharge status: Home / Self Care | Payer: Medicaid Other | Attending: Emergency Medicine | Admitting: Emergency Medicine

## 2014-07-22 ENCOUNTER — Encounter (HOSPITAL_COMMUNITY): Payer: Self-pay | Admitting: Emergency Medicine

## 2014-07-22 DIAGNOSIS — S91311A Laceration without foreign body, right foot, initial encounter: Secondary | ICD-10-CM

## 2014-07-22 DIAGNOSIS — O9A212 Injury, poisoning and certain other consequences of external causes complicating pregnancy, second trimester: Secondary | ICD-10-CM | POA: Insufficient documentation

## 2014-07-22 DIAGNOSIS — Z3A20 20 weeks gestation of pregnancy: Secondary | ICD-10-CM | POA: Insufficient documentation

## 2014-07-22 DIAGNOSIS — W25XXXA Contact with sharp glass, initial encounter: Secondary | ICD-10-CM | POA: Diagnosis not present

## 2014-07-22 DIAGNOSIS — IMO0002 Reserved for concepts with insufficient information to code with codable children: Secondary | ICD-10-CM

## 2014-07-22 DIAGNOSIS — Z792 Long term (current) use of antibiotics: Secondary | ICD-10-CM | POA: Diagnosis not present

## 2014-07-22 DIAGNOSIS — F17210 Nicotine dependence, cigarettes, uncomplicated: Secondary | ICD-10-CM | POA: Insufficient documentation

## 2014-07-22 DIAGNOSIS — O99332 Smoking (tobacco) complicating pregnancy, second trimester: Secondary | ICD-10-CM | POA: Insufficient documentation

## 2014-07-22 DIAGNOSIS — Z79899 Other long term (current) drug therapy: Secondary | ICD-10-CM | POA: Insufficient documentation

## 2014-07-22 DIAGNOSIS — Y92009 Unspecified place in unspecified non-institutional (private) residence as the place of occurrence of the external cause: Secondary | ICD-10-CM | POA: Diagnosis not present

## 2014-07-22 DIAGNOSIS — Y9389 Activity, other specified: Secondary | ICD-10-CM | POA: Diagnosis not present

## 2014-07-22 MED ORDER — LIDOCAINE HCL 1 % IJ SOLN
5.0000 mL | Freq: Once | INTRAMUSCULAR | Status: AC
  Administered 2014-07-22: 5 mL via INTRADERMAL
  Filled 2014-07-22: qty 20

## 2014-07-22 MED ORDER — CEPHALEXIN 500 MG PO CAPS: 500.0000 mg | ORAL_CAPSULE | Freq: Four times a day (QID) | ORAL | Status: DC

## 2014-07-22 MED ORDER — ACETAMINOPHEN ER 650 MG PO TBCR: 650.0000 mg | EXTENDED_RELEASE_TABLET | Freq: Three times a day (TID) | ORAL | Status: DC | PRN

## 2014-07-22 MED ORDER — OXYCODONE-ACETAMINOPHEN 5-325 MG PO TABS
2.0000 | ORAL_TABLET | Freq: Once | ORAL | Status: AC
  Administered 2014-07-22: 2 via ORAL
  Filled 2014-07-22: qty 2

## 2014-07-22 NOTE — ED Notes (Signed)
Bed: [facility] Expected date:  Expected time:  Means of arrival:  Comments: EMS foot laceration

## 2014-07-22 NOTE — ED Notes (Signed)
Delay in d/c d/t pt needing a ride home.

## 2014-07-22 NOTE — ED Notes (Addendum)
Pt presents by EMS from home after stepping on glass of water. Per EMS considerable lac noted, moderate blood loss, foot bandaged by EMS. Pt is 18W preg.

## 2014-07-22 NOTE — ED Provider Notes (Signed)
Medical screening examination/treatment/procedure(s) were performed by non-physician practitioner and as supervising physician I was immediately available for consultation/collaboration.   Delora Fuel, MD 01/00/71 2197

## 2014-07-22 NOTE — Discharge Instructions (Signed)
Take Keflex as directed until gone. Take tylenol as needed for pain. Refer to attached documents for more information.

## 2014-07-22 NOTE — ED Provider Notes (Signed)
CSN: [id number]     Arrival date & time 07/22/14  0207 History   First MD Initiated Contact with Patient 07/22/14 0255     Chief Complaint  Patient presents with  . Laceration     (Consider location/radiation/quality/duration/timing/severity/associated sxs/prior Treatment) HPI Comments: Patient is a 18 year old female who is [redacted] weeks pregnant who presents via EMS after stepping on a glass of water at home. The glass broke when she stepped on it causing a laceration of her right foot. Patient reports sharp and severe pain without radiation. Palpation makes the pain worse. No alleviating factors. No other injuries or associated symptoms.    History reviewed. No pertinent past medical history. Past Surgical History  Procedure Laterality Date  . Tonsillectomy     No family history on file. History  Substance Use Topics  . Smoking status: Current Every Day Smoker  . Smokeless tobacco: Not on file  . Alcohol Use: Yes   OB History   Grav Para Term Preterm Abortions TAB SAB Ect Mult Living   1              Review of Systems  Constitutional: Negative for fever, chills and fatigue.  HENT: Negative for trouble swallowing.   Eyes: Negative for visual disturbance.  Respiratory: Negative for shortness of breath.   Cardiovascular: Negative for chest pain and palpitations.  Gastrointestinal: Negative for nausea, vomiting, abdominal pain and diarrhea.  Genitourinary: Negative for dysuria and difficulty urinating.  Musculoskeletal: Negative for arthralgias and neck pain.  Skin: Positive for wound. Negative for color change.  Neurological: Negative for dizziness and weakness.  Psychiatric/Behavioral: Negative for dysphoric mood.      Allergies  Review of patient's allergies indicates no known allergies.  Home Medications   Prior to Admission medications   Medication Sig Start Date End Date Taking? Authorizing Provider  cephALEXin (KEFLEX) 500 MG capsule Take 1 capsule (500 mg  total) by mouth 2 (two) times daily. 07/15/14   Merryl Hacker, MD  metroNIDAZOLE (FLAGYL) 500 MG tablet Take 1 tablet (500 mg total) by mouth 2 (two) times daily. 07/15/14   Merryl Hacker, MD  Prenatal Vit-Min-FA-Fish Oil ([facility] PRENATAL GUMMY PO) Take 2 tablets by mouth daily.    Historical Provider, MD   BP 139/67  Pulse 88  Temp(Src) 98.3 F (36.8 C) (Oral)  Resp 20  SpO2 100%  LMP 03/27/2014 Physical Exam  Nursing note and vitals reviewed. Constitutional: She is oriented to person, place, and time. She appears well-developed and well-nourished. No distress.  HENT:  Head: Normocephalic and atraumatic.  Eyes: Conjunctivae and EOM are normal.  Neck: Normal range of motion.  Cardiovascular: Normal rate and regular rhythm.  Exam reveals no gallop and no friction rub.   No murmur heard. Pulmonary/Chest: Effort normal and breath sounds normal. She has no wheezes. She has no rales. She exhibits no tenderness.  Abdominal: Soft. There is no tenderness.  Musculoskeletal: Normal range of motion.  Patient is able to wiggle toes of right foot.   Neurological: She is alert and oriented to person, place, and time. Coordination normal.  Sensation intact of toes of right foot. Speech is goal-oriented. Moves limbs without ataxia.   Skin: Skin is warm and dry.  4cm laceration to plantar aspect of right foot. No foreign body noted. Bleeding controlled at this time.   Psychiatric: She has a normal mood and affect. Her behavior is normal.    ED Course  Procedures (including critical care time)  Labs Review Labs Reviewed - No data to display  LACERATION REPAIR Performed by: Alvina Chou Authorized by: Alvina Chou Consent: Verbal consent obtained. Risks and benefits: risks, benefits and alternatives were discussed Consent given by: patient Patient identity confirmed: provided demographic data Prepped and Draped in normal sterile fashion Wound explored  Laceration Location:  plantar aspect of right foot  Laceration Length: 7cm  No Foreign Bodies seen or palpated  Anesthesia: local infiltration  Local anesthetic: lidocaine 1% without epinephrine  Anesthetic total: 4 ml  Irrigation method: syringe Amount of cleaning: standard  Skin closure: 4-0 prolene  Number of sutures: 8  Technique: simple  Patient tolerance: Patient tolerated the procedure well with no immediate complications.   Imaging Review Dg Foot Complete Right  07/22/2014   CLINICAL DATA:  Stepped on glass with toe lacerations. Initial encounter  EXAM: RIGHT FOOT COMPLETE - 3+ VIEW  COMPARISON:  None.  FINDINGS: No acute fracture or malalignment.  There is a 1 mm high density focus in the plantar forefoot soft tissues, at the level of the metatarsal necks, which is not definitive for foreign body given the subtle appearance and lack of visualization in the frontal projection. No subcutaneous gas.  IMPRESSION: Negative for fracture. No definitive foreign body.   Electronically Signed   By: Jorje Guild M.D.   On: 07/22/2014 02:31     EKG Interpretation None      MDM   Final diagnoses:  Foot laceration, right, initial encounter    3:09 AM Xray shows no foreign body or acute fracture. Patient will have percocet for pain.   4:40 AM Patient's laceration repaired without difficulty. Patient will have keflex and tylenol for laceration. No other injuries. Patient instructed to return in 10 days for suture removal.    Alvina Chou, PA-C 07/22/14 [phone]

## 2014-07-29 ENCOUNTER — Encounter (HOSPITAL_COMMUNITY): Payer: Self-pay | Admitting: Emergency Medicine

## 2015-05-20 ENCOUNTER — Encounter (HOSPITAL_COMMUNITY): Payer: Self-pay | Admitting: *Deleted

## 2017-02-25 DIAGNOSIS — B009 Herpesviral infection, unspecified: Secondary | ICD-10-CM

## 2017-02-25 HISTORY — DX: Herpesviral infection, unspecified: B00.9

## 2017-03-23 ENCOUNTER — Ambulatory Visit (INDEPENDENT_AMBULATORY_CARE_PROVIDER_SITE_OTHER): Payer: BLUE CROSS/BLUE SHIELD | Admitting: Obstetrics and Gynecology

## 2017-03-23 ENCOUNTER — Encounter: Payer: Self-pay | Admitting: Obstetrics and Gynecology

## 2017-03-23 VITALS — BP 136/70 | HR 80 | Resp 16 | Ht 66.0 in | Wt 288.0 lb

## 2017-03-23 DIAGNOSIS — N760 Acute vaginitis: Secondary | ICD-10-CM | POA: Diagnosis not present

## 2017-03-23 DIAGNOSIS — Z113 Encounter for screening for infections with a predominantly sexual mode of transmission: Secondary | ICD-10-CM

## 2017-03-23 DIAGNOSIS — L732 Hidradenitis suppurativa: Secondary | ICD-10-CM | POA: Diagnosis not present

## 2017-03-23 DIAGNOSIS — Z01419 Encounter for gynecological examination (general) (routine) without abnormal findings: Secondary | ICD-10-CM

## 2017-03-23 NOTE — Patient Instructions (Addendum)
Intrauterine Device Information An intrauterine device (IUD) is inserted into your uterus to prevent pregnancy. There are two types of IUDs available:  Copper IUD-This type of IUD is wrapped in copper wire and is placed inside the uterus. Copper makes the uterus and fallopian tubes produce a fluid that kills sperm. The copper IUD can stay in place for 10 years.  Hormone IUD-This type of IUD contains the hormone progestin (synthetic progesterone). The hormone thickens the cervical mucus and prevents sperm from entering the uterus. It also thins the uterine lining to prevent implantation of a fertilized egg. The hormone can weaken or kill the sperm that get into the uterus. One type of hormone IUD can stay in place for 5 years, and another type can stay in place for 3 years.  Your health care provider will make sure you are a good candidate for a contraceptive IUD. Discuss with your health care provider the possible side effects. Advantages of an intrauterine device  IUDs are highly effective, reversible, long acting, and low maintenance.  There are no estrogen-related side effects.  An IUD can be used when breastfeeding.  IUDs are not associated with weight gain.  The copper IUD works immediately after insertion.  The hormone IUD works right away if inserted within 7 days of your period starting. You will need to use a backup method of birth control for 7 days if the hormone IUD is inserted at any other time in your cycle.  The copper IUD does not interfere with your female hormones.  The hormone IUD can make heavy menstrual periods lighter and decrease cramping.  The hormone IUD can be used for 3 or 5 years.  The copper IUD can be used for 10 years. Disadvantages of an intrauterine device  The hormone IUD can be associated with irregular bleeding patterns.  The copper IUD can make your menstrual flow heavier and more painful.  You may experience cramping and vaginal bleeding after  insertion. This information is not intended to replace advice given to you by your health care provider. Make sure you discuss any questions you have with your health care provider. Document Released: 08/17/2004 Document Revised: 02/19/2016 Document Reviewed: 03/04/2013 Elsevier Interactive Patient Education  2017 [employer]  EXERCISE AND DIET:  We recommended that you start or continue a regular exercise program for good health. Regular exercise means any activity that makes your heart beat faster and makes you sweat.  We recommend exercising at least 30 minutes per day at least 3 days a week, preferably 4 or 5.  We also recommend a diet low in fat and sugar.  Inactivity, poor dietary choices and obesity can cause diabetes, heart attack, stroke, and kidney damage, among others.    ALCOHOL AND SMOKING:  Women should limit their alcohol intake to no more than 7 drinks/beers/glasses of wine (combined, not each!) per week. Moderation of alcohol intake to this level decreases your risk of breast cancer and liver damage. And of course, no recreational drugs are part of a healthy lifestyle.  And absolutely no smoking or even second hand smoke. Most people know smoking can cause heart and lung diseases, but did you know it also contributes to weakening of your bones? Aging of your skin?  Yellowing of your teeth and nails?  CALCIUM AND VITAMIN D:  Adequate intake of calcium and Vitamin D are recommended.  The recommendations for exact amounts of these supplements seem to change often, but generally speaking 600 mg of calcium (either   carbonate or citrate) and 800 units of Vitamin D per day seems prudent. Certain women may benefit from higher intake of Vitamin D.  If you are among these women, your doctor will have told you during your visit.    PAP SMEARS:  Pap smears, to check for cervical cancer or precancers,  have traditionally been done yearly, although recent scientific advances have shown that most  women can have pap smears less often.  However, every woman still should have a physical exam from her gynecologist every year. It will include a breast check, inspection of the vulva and vagina to check for abnormal growths or skin changes, a visual exam of the cervix, and then an exam to evaluate the size and shape of the uterus and ovaries.  And after 23 years of age, a rectal exam is indicated to check for rectal cancers. We will also provide age appropriate advice regarding health maintenance, like when you should have certain vaccines, screening for sexually transmitted diseases, bone density testing, colonoscopy, mammograms, etc.   MAMMOGRAMS:  All women over 42 years old should have a yearly mammogram. Many facilities now offer a "3D" mammogram, which may cost around $50 extra out of pocket. If possible,  we recommend you accept the option to have the 3D mammogram performed.  It both reduces the number of women who will be called back for extra views which then turn out to be normal, and it is better than the routine mammogram at detecting truly abnormal areas.    COLONOSCOPY:  Colonoscopy to screen for colon cancer is recommended for all women at age 41.  We know, you hate the idea of the prep.  We agree, BUT, having colon cancer and not knowing it is worse!!  Colon cancer so often starts as a polyp that can be seen and removed at colonscopy, which can quite literally save your life!  And if your first colonoscopy is normal and you have no family history of colon cancer, most women don't have to have it again for 10 years.  Once every ten years, you can do something that may end up saving your life, right?  We will be happy to help you get it scheduled when you are ready.  Be sure to check your insurance coverage so you understand how much it will cost.  It may be covered as a preventative service at no cost, but you should check your particular policy.    Hidradenitis Suppurativa Hidradenitis  suppurativa is a long-term (chronic) skin disease that starts with blocked sweat glands or hair follicles. Bacteria may grow in these blocked openings of your skin. Hidradenitis suppurativa is like a severe form of acne that develops in areas of your body where acne would be unusual. It is most likely to affect the areas of your body where skin rubs against skin and becomes moist. This includes your:  Underarms.  Groin.  Genital areas.  Buttocks.  Upper thighs.  Breasts.  Hidradenitis suppurativa may start out with small pimples. The pimples can develop into deep sores that break open (rupture) and drain pus. Over time your skin may thicken and become scarred. Hidradenitis suppurativa cannot be passed from person to person. What are the causes? The exact cause of hidradenitis suppurativa is not known. This condition may be due to:  Female and female hormones. The condition is rare before and after puberty.  An overactive body defense system (immune system). Your immune system may overreact to the blocked hair  follicles or sweat glands and cause swelling and pus-filled sores.  What increases the risk? You may have a higher risk of hidradenitis suppurativa if you:  Are a woman.  Are between ages 62 and 109.  Have a family history of hidradenitis suppurativa.  Have a personal history of acne.  Are overweight.  Smoke.  Take the drug lithium.  What are the signs or symptoms? The first signs of an outbreak are usually painful skin bumps that look like pimples. As the condition progresses:  Skin bumps may get bigger and grow deeper into the skin.  Bumps under the skin may rupture and drain smelly pus.  Skin may become itchy and infected.  Skin may thicken and scar.  Drainage may continue through tunnels under the skin (fistulas).  Walking and moving your arms can become painful.  How is this diagnosed? Your health care provider may diagnose hidradenitis suppurativa based  on your medical history and your signs and symptoms. A physical exam will also be done. You may need to see a health care provider who specializes in skin diseases (dermatologist). You may also have tests done to confirm the diagnosis. These can include:  Swabbing a sample of pus or drainage from your skin so it can be sent to the lab and tested for infection.  Blood tests to check for infection.  How is this treated? The same treatment will not work for everybody with hidradenitis suppurativa. Your treatment will depend on how severe your symptoms are. You may need to try several treatments to find what works best for you. Part of your treatment may include cleaning and bandaging (dressing) your wounds. You may also have to take medicines, such as the following:  Antibiotics.  Acne medicines.  Medicines to block or suppress the immune system.  A diabetes medicine (metformin) is sometimes used to treat this condition.  For women, birth control pills can sometimes help relieve symptoms.  You may need surgery if you have a severe case of hidradenitis suppurativa that does not respond to medicine. Surgery may involve:  Using a laser to clear the skin and remove hair follicles.  Opening and draining deep sores.  Removing the areas of skin that are diseased and scarred.  Follow these instructions at home:  Learn as much as you can about your disease, and work closely with your health care providers.  Take medicines only as directed by your health care provider.  If you were prescribed an antibiotic medicine, finish it all even if you start to feel better.  If you are overweight, losing weight may be very helpful. Try to reach and maintain a healthy weight.  Do not use any tobacco products, including cigarettes, chewing tobacco, or electronic cigarettes. If you need help quitting, ask your health care provider.  Do not shave the areas where you get hidradenitis suppurativa.  Do not  wear deodorant.  Wear loose-fitting clothes.  Try not to overheat and get sweaty.  Take a daily bleach bath as directed by your health care provider. ? Fill your bathtub halfway with water. ? Pour in  cup of unscented household bleach. ? Soak for 5-10 minutes.  Cover sore areas with a warm, clean washcloth (compress) for 5-10 minutes. Contact a health care provider if:  You have a flare-up of hidradenitis suppurativa.  You have chills or a fever.  You are having trouble controlling your symptoms at home. This information is not intended to replace advice given to you by your  health care provider. Make sure you discuss any questions you have with your health care provider. Document Released: 04/27/2004 Document Revised: 02/19/2016 Document Reviewed: 12/14/2013 Elsevier Interactive Patient Education  2018 [employer].

## 2017-03-23 NOTE — Progress Notes (Signed)
21 y.o. [id number] Single African American female here as a new patient for annual exam. Patient also would like STD screening. Complains of vaginal itching since last week (5days). No referral.  Wants testing for herpes.  Thinks she has had an exposure to this.  No know sores.   Not trying for pregnancy but not using condoms all the time.  Skeptical about using hormonal contraception.   Has twins that are 23 years old.  Little girl and little boy.  Works at [employer] as a Freight forwarder.   PCP:   No PCP per patient  Patient's last menstrual period was 03/02/2017 (within days).     Period Cycle (Days): 28 Period Duration (Days): 4-5 Period Pattern: Regular Menstrual Flow: Moderate Menstrual Control: Thin pad Menstrual Control Change Freq (Hours): 3 Dysmenorrhea: (!) Moderate Dysmenorrhea Symptoms: Cramping, Throbbing, Nausea  Some cramping in between cycles.      Sexually active: Yes.    The current method of family planning is condoms sometimes.    Exercising: No.  The patient does not participate in regular exercise at present.   Smoker:  Yes, marijuana  Health Maintenance: Pap:  About 2016 -- normal per patient done at [facility] in [place], [place] History of abnormal Pap:  no TDaP:  About 2016 Gardasil:   Yes, per patient in high school HIV: negative with pregnancy Screening Labs:  Discuss today  Urine today: dirty urine obtained for testing   reports that she has been smoking.  She has never used smokeless tobacco. She reports that she drinks alcohol. She reports that she uses drugs, including Marijuana.  Past Medical History:  Diagnosis Date  . STD (sexually transmitted disease)    Chlamydia    Past Surgical History:  Procedure Laterality Date  . CESAREAN SECTION    . TONSILLECTOMY    . WISDOM TOOTH EXTRACTION      Current Outpatient Prescriptions  Medication Sig Dispense Refill  . acetaminophen (TYLENOL 8 HOUR) 650 MG CR tablet Take 1 tablet (650 mg total)  by mouth every 8 (eight) hours as needed for pain. 30 tablet 0   No current facility-administered medications for this visit.     Family History  Problem Relation Age of Onset  . Anemia Mother   . Breast cancer Maternal Aunt   . Hypertension Maternal Grandmother   . Heart Problems Maternal Grandmother   . Diabetes Maternal Grandfather     ROS:  Pertinent items are noted in HPI.  Otherwise, a comprehensive ROS was negative.  Exam:   BP 136/70 (BP Location: Right Arm, Patient Position: Sitting, Cuff Size: Large)   Pulse 80   Resp 16   Ht 5\' 6"  (1.676 m)   Wt 288 lb (130.6 kg)   LMP 03/02/2017 (Within Days)   Breastfeeding? No   BMI 46.48 kg/m     General appearance: alert, cooperative and appears stated age Head: Normocephalic, without obvious abnormality, atraumatic Neck: no adenopathy, supple, symmetrical, trachea midline and thyroid normal to inspection and palpation Lungs: clear to auscultation bilaterally Breasts: normal appearance, no masses or tenderness, No nipple retraction or dimpling, No nipple discharge or bleeding, No axillary or supraclavicular adenopathy Heart: regular rate and rhythm Abdomen: soft, non-tender; no masses, no organomegaly Extremities: extremities normal, atraumatic, no cyanosis or edema Skin: Skin color, texture, turgor normal.  Multiple boils of skin of abdomen and thighs. Lymph nodes: Cervical, supraclavicular, and axillary nodes normal. No abnormal inguinal nodes palpated Neurologic: Grossly normal  Pelvic: External genitalia:  no lesions              Urethra:  normal appearing urethra with no masses, tenderness or lesions              Bartholins and Skenes: normal                 Vagina: normal appearing vagina with normal color and discharge, no lesions              Cervix: no lesions              Pap taken: No. Bimanual Exam:  Uterus:  normal size, contour, position, consistency, mobility, non-tender              Adnexa: no mass,  fullness, tenderness         Chaperone was present for exam.  Assessment:   Well woman visit with normal exam. Desire for STD testing.  Vaginitis.  Possible hidradenitis suppurativa.   Plan: Mammogram screening discussed. Recommended self breast awareness. Pap and HR HPV as above. Guidelines for Calcium, Vitamin D, regular exercise program including cardiovascular and weight bearing exercise. Full STD testing and routine labs. Will get last pap report.  Discussed barrier methods of contraception and IUDs - Mirena and Software engineer.  Discussed differences in IUDs, importance of STD prevention with use, and complications of uterine perforation, IUD expelling, and occasional need to removal through hysteroscopic guidance.  She will consider options.  Referral to dermatology.  Follow up annually and prn.    After visit summary provided.

## 2017-03-24 ENCOUNTER — Encounter: Payer: Self-pay | Admitting: Obstetrics and Gynecology

## 2017-03-24 LAB — HEP, RPR, HIV PANEL
HIV Screen 4th Generation wRfx: NONREACTIVE
Hepatitis B Surface Ag: NEGATIVE
RPR Ser Ql: NONREACTIVE

## 2017-03-24 LAB — CBC
Hematocrit: 35.1 % (ref 34.0–46.6)
Hemoglobin: 11 g/dL — ABNORMAL LOW (ref 11.1–15.9)
MCH: 24.7 pg — ABNORMAL LOW (ref 26.6–33.0)
MCHC: 31.3 g/dL — ABNORMAL LOW (ref 31.5–35.7)
MCV: 79 fL (ref 79–97)
Platelets: 282 10*3/uL (ref 150–379)
RBC: 4.46 x10E6/uL (ref 3.77–5.28)
RDW: 16.6 % — AB (ref 12.3–15.4)
WBC: 10.4 10*3/uL (ref 3.4–10.8)

## 2017-03-24 LAB — COMPREHENSIVE METABOLIC PANEL
ALK PHOS: 64 IU/L (ref 39–117)
ALT: 12 IU/L (ref 0–32)
AST: 15 IU/L (ref 0–40)
Albumin/Globulin Ratio: 1.6 (ref 1.2–2.2)
Albumin: 4.1 g/dL (ref 3.5–5.5)
BUN/Creatinine Ratio: 12 (ref 9–23)
BUN: 10 mg/dL (ref 6–20)
Bilirubin Total: 0.2 mg/dL (ref 0.0–1.2)
CO2: 23 mmol/L (ref 20–29)
Calcium: 9 mg/dL (ref 8.7–10.2)
Chloride: 105 mmol/L (ref 96–106)
Creatinine, Ser: 0.86 mg/dL (ref 0.57–1.00)
GFR calc Af Amer: 110 mL/min/{1.73_m2} (ref >59)
GFR calc non Af Amer: 96 mL/min/{1.73_m2} (ref >59)
Globulin, Total: 2.6 g/dL (ref 1.5–4.5)
Glucose: 87 mg/dL (ref 65–99)
Potassium: 4.4 mmol/L (ref 3.5–5.2)
Sodium: 138 mmol/L (ref 134–144)
Total Protein: 6.7 g/dL (ref 6.0–8.5)

## 2017-03-24 LAB — GC/CHLAMYDIA PROBE AMP
Chlamydia trachomatis, NAA: NEGATIVE
NEISSERIA GONORRHOEAE BY PCR: NEGATIVE

## 2017-03-24 LAB — LIPID PANEL
CHOL/HDL RATIO: 4.4 ratio (ref 0.0–4.4)
Cholesterol, Total: 163 mg/dL (ref 100–199)
HDL: 37 mg/dL — AB (ref >39)
LDL Calculated: 114 mg/dL — ABNORMAL HIGH (ref 0–99)
Triglycerides: 61 mg/dL (ref 0–149)
VLDL Cholesterol Cal: 12 mg/dL (ref 5–40)

## 2017-03-24 LAB — HSV(HERPES SIMPLEX VRS) I + II AB-IGG
HSV 1 GLYCOPROTEIN G AB, IGG: 12.7 {index} — AB (ref 0.00–0.90)
HSV 2 Glycoprotein G Ab, IgG: 9.78 index — ABNORMAL HIGH (ref 0.00–0.90)

## 2017-03-24 LAB — HEPATITIS C ANTIBODY

## 2017-03-24 LAB — VAGINITIS/VAGINOSIS, DNA PROBE
CANDIDA SPECIES: POSITIVE — AB
GARDNERELLA VAGINALIS: NEGATIVE
TRICHOMONAS VAG: NEGATIVE

## 2017-03-24 LAB — TSH: TSH: 0.985 u[IU]/mL (ref 0.450–4.500)

## 2017-03-28 ENCOUNTER — Telehealth: Payer: Self-pay

## 2017-03-28 MED ORDER — FLUCONAZOLE 150 MG PO TABS: 150.0000 mg | ORAL_TABLET | Freq: Once | ORAL | 0 refills | Status: AC

## 2017-03-28 NOTE — Telephone Encounter (Signed)
-----   Message from Nunzio Cobbs, MD sent at 03/24/2017  8:44 PM EDT ----- Please contact patient with results of testing.  Her vaginitis test is positive for yeast infection.  I am recommending Diflucan 150 mg orally x 1.  May repeat in 72 hours if needed. Please send to pharmacy of choice.   Her cholesterol testing shows mildly elevated LDL cholesterol.  She can lower this through exercise, a diet low in cholesterol, and with weight loss.   She has very very mild anemia.  I would recommend she take a multivitamin with iron. Flinstones with iron are even a good choice!  Her blood chemistries and thyroid are normal.   Testing is negative for HIV, syphilis, hepatitis B and C, and gonorrhea and chlamydia.   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  Patient had concerns about prior exposure when she came for her visit.  She may want a consultation to discuss herpes further.

## 2017-03-28 NOTE — Telephone Encounter (Signed)
Spoke with patient. Advised of results and message as seen below from [place]. Patient verbalizes understanding. Rx for Diflucan 150 mg orally x 1 repeat in 72 hours if symptoms persist #2 0RF sent to pharmacy on file. Patient would like to come in to be seen to discuss results. Appointment scheduled for 03/31/2017 at 9:30 am with Dr.Silva. Patient is agreeable to date and time.  Routing to provider for final review. Patient agreeable to disposition. Will close encounter.

## 2017-03-31 ENCOUNTER — Ambulatory Visit: Payer: BLUE CROSS/BLUE SHIELD | Admitting: Obstetrics and Gynecology

## 2017-03-31 NOTE — Progress Notes (Deleted)
GYNECOLOGY  VISIT   HPI: 21 y.o.   Single  African American  female   [phone] with Patient's last menstrual period was 03/02/2017 (within days).   here for  Discuss results   GYNECOLOGIC HISTORY: Patient's last menstrual period was 03/02/2017 (within days). Contraception:  *** Menopausal hormone therapy:  n/a Last mammogram:  n/a Last pap smear:   About 2016 -- normal per patient done at [facility] in [place], [place]        [place] History    Gravida Para Term Preterm AB Living   1 1 1  0 0 2   SAB TAB Ectopic Multiple Live Births   0 0 0 0 0         There are no active problems to display for this patient.   Past Medical History:  Diagnosis Date  . 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 STD (sexually transmitted disease)    Chlamydia    Past Surgical History:  Procedure Laterality Date  . CESAREAN SECTION    . TONSILLECTOMY    . WISDOM TOOTH EXTRACTION      Current Outpatient Prescriptions  Medication Sig Dispense Refill  . acetaminophen (TYLENOL 8 HOUR) 650 MG CR tablet Take 1 tablet (650 mg total) by mouth every 8 (eight) hours as needed for pain. 30 tablet 0   No current facility-administered medications for this visit.      ALLERGIES: Patient has no known allergies.  Family History  Problem Relation Age of Onset  . Anemia Mother   . Breast cancer Maternal Aunt   . Hypertension Maternal Grandmother   . Heart Problems Maternal Grandmother   . Diabetes Maternal Grandfather     Social History   Social History  . Marital status: Single    Spouse name: N/A  . Number of children: N/A  . Years of education: N/A   Occupational History  . Not on file.   Social History Main Topics  . Smoking status: Current Some Day Smoker  . Smokeless tobacco: Never Used     Comment: marijuana  . Alcohol use Yes     Comment: occasional  . Drug use: Yes    Types: Marijuana  . Sexual activity: Yes    Birth control/ protection: Condom    Other Topics Concern  . Not on file   Social History Narrative  . No narrative on file    ROS:  Pertinent items are noted in HPI.  PHYSICAL EXAMINATION:    LMP 03/02/2017 (Within Days)     General appearance: alert, cooperative and appears stated age Head: Normocephalic, without obvious abnormality, atraumatic Neck: no adenopathy, supple, symmetrical, trachea midline and thyroid normal to inspection and palpation Lungs: clear to auscultation bilaterally Breasts: normal appearance, no masses or tenderness, No nipple retraction or dimpling, No nipple discharge or bleeding, No axillary or supraclavicular adenopathy Heart: regular rate and rhythm Abdomen: soft, non-tender, no masses,  no organomegaly Extremities: extremities normal, atraumatic, no cyanosis or edema Skin: Skin color, texture, turgor normal. No rashes or lesions Lymph nodes: Cervical, supraclavicular, and axillary nodes normal. No abnormal inguinal nodes palpated Neurologic: Grossly normal  Pelvic: External genitalia:  no lesions              Urethra:  normal appearing urethra with no masses, tenderness or lesions              Bartholins and Skenes: normal  Vagina: normal appearing vagina with normal color and discharge, no lesions              Cervix: no lesions                Bimanual Exam:  Uterus:  normal size, contour, position, consistency, mobility, non-tender              Adnexa: no mass, fullness, tenderness              Rectal exam: {yes no:314532}.  Confirms.              Anus:  normal sphincter tone, no lesions  Chaperone was present for exam.  ASSESSMENT     PLAN     An After Visit Summary was printed and given to the patient.  ______ minutes face to face time of which over 50% was spent in counseling.

## 2017-04-04 ENCOUNTER — Ambulatory Visit: Payer: BLUE CROSS/BLUE SHIELD | Admitting: Obstetrics and Gynecology

## 2017-04-04 NOTE — Progress Notes (Deleted)
GYNECOLOGY  VISIT   HPI: 21 y.o.   Single  African American  female   [phone] with No LMP recorded.   here for     GYNECOLOGIC HISTORY: No LMP recorded. Contraception:  *** Menopausal hormone therapy:  n/a Last mammogram:  n/a Last pap smear:    About 2016 -- normal per patient done at [facility] in [place], [place]        [place] History    Gravida Para Term Preterm AB Living   1 1 1  0 0 2   SAB TAB Ectopic Multiple Live Births   0 0 0 0 0         There are no active problems to display for this patient.   Past Medical History:  Diagnosis Date  . 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 STD (sexually transmitted disease)    Chlamydia    Past Surgical History:  Procedure Laterality Date  . CESAREAN SECTION    . TONSILLECTOMY    . WISDOM TOOTH EXTRACTION      Current Outpatient Prescriptions  Medication Sig Dispense Refill  . acetaminophen (TYLENOL 8 HOUR) 650 MG CR tablet Take 1 tablet (650 mg total) by mouth every 8 (eight) hours as needed for pain. 30 tablet 0   No current facility-administered medications for this visit.      ALLERGIES: Patient has no known allergies.  Family History  Problem Relation Age of Onset  . Anemia Mother   . Breast cancer Maternal Aunt   . Hypertension Maternal Grandmother   . Heart Problems Maternal Grandmother   . Diabetes Maternal Grandfather     Social History   Social History  . Marital status: Single    Spouse name: N/A  . Number of children: N/A  . Years of education: N/A   Occupational History  . Not on file.   Social History Main Topics  . Smoking status: Current Some Day Smoker  . Smokeless tobacco: Never Used     Comment: marijuana  . Alcohol use Yes     Comment: occasional  . Drug use: Yes    Types: Marijuana  . Sexual activity: Yes    Birth control/ protection: Condom   Other Topics Concern  . Not on file   Social History Narrative  . No narrative on file     ROS:  Pertinent items are noted in HPI.  PHYSICAL EXAMINATION:    There were no vitals taken for this visit.    General appearance: alert, cooperative and appears stated age Head: Normocephalic, without obvious abnormality, atraumatic Neck: no adenopathy, supple, symmetrical, trachea midline and thyroid normal to inspection and palpation Lungs: clear to auscultation bilaterally Breasts: normal appearance, no masses or tenderness, No nipple retraction or dimpling, No nipple discharge or bleeding, No axillary or supraclavicular adenopathy Heart: regular rate and rhythm Abdomen: soft, non-tender, no masses,  no organomegaly Extremities: extremities normal, atraumatic, no cyanosis or edema Skin: Skin color, texture, turgor normal. No rashes or lesions Lymph nodes: Cervical, supraclavicular, and axillary nodes normal. No abnormal inguinal nodes palpated Neurologic: Grossly normal  Pelvic: External genitalia:  no lesions              Urethra:  normal appearing urethra with no masses, tenderness or lesions              Bartholins and Skenes: normal  Vagina: normal appearing vagina with normal color and discharge, no lesions              Cervix: no lesions                Bimanual Exam:  Uterus:  normal size, contour, position, consistency, mobility, non-tender              Adnexa: no mass, fullness, tenderness              Rectal exam: {yes no:314532}.  Confirms.              Anus:  normal sphincter tone, no lesions  Chaperone was present for exam.  ASSESSMENT     PLAN     An After Visit Summary was printed and given to the patient.  ______ minutes face to face time of which over 50% was spent in counseling.

## 2017-04-06 ENCOUNTER — Telehealth: Payer: Self-pay | Admitting: Obstetrics and Gynecology

## 2017-04-06 ENCOUNTER — Encounter: Payer: Self-pay | Admitting: Obstetrics and Gynecology

## 2017-04-06 ENCOUNTER — Ambulatory Visit: Payer: BLUE CROSS/BLUE SHIELD | Admitting: Obstetrics and Gynecology

## 2017-04-06 NOTE — Progress Notes (Deleted)
GYNECOLOGY  VISIT   HPI: 21 y.o.   Single  African American  female   [phone] with No LMP recorded.   here for     GYNECOLOGIC HISTORY: No LMP recorded. Contraception:  *** Menopausal hormone therapy:  n/a Last mammogram:  n/a Last pap smear:   About 2016 -- normal per patient done at [facility] in [place], [place]        [place] History    Gravida Para Term Preterm AB Living   1 1 1  0 0 2   SAB TAB Ectopic Multiple Live Births   0 0 0 0 0         There are no active problems to display for this patient.   Past Medical History:  Diagnosis Date  . 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 STD (sexually transmitted disease)    Chlamydia    Past Surgical History:  Procedure Laterality Date  . CESAREAN SECTION    . TONSILLECTOMY    . WISDOM TOOTH EXTRACTION      Current Outpatient Prescriptions  Medication Sig Dispense Refill  . acetaminophen (TYLENOL 8 HOUR) 650 MG CR tablet Take 1 tablet (650 mg total) by mouth every 8 (eight) hours as needed for pain. 30 tablet 0   No current facility-administered medications for this visit.      ALLERGIES: Patient has no known allergies.  Family History  Problem Relation Age of Onset  . Anemia Mother   . Breast cancer Maternal Aunt   . Hypertension Maternal Grandmother   . Heart Problems Maternal Grandmother   . Diabetes Maternal Grandfather     Social History   Social History  . Marital status: Single    Spouse name: N/A  . Number of children: N/A  . Years of education: N/A   Occupational History  . Not on file.   Social History Main Topics  . Smoking status: Current Some Day Smoker  . Smokeless tobacco: Never Used     Comment: marijuana  . Alcohol use Yes     Comment: occasional  . Drug use: Yes    Types: Marijuana  . Sexual activity: Yes    Birth control/ protection: Condom   Other Topics Concern  . Not on file   Social History Narrative  . No narrative on file     ROS:  Pertinent items are noted in HPI.  PHYSICAL EXAMINATION:    There were no vitals taken for this visit.    General appearance: alert, cooperative and appears stated age Head: Normocephalic, without obvious abnormality, atraumatic Neck: no adenopathy, supple, symmetrical, trachea midline and thyroid normal to inspection and palpation Lungs: clear to auscultation bilaterally Breasts: normal appearance, no masses or tenderness, No nipple retraction or dimpling, No nipple discharge or bleeding, No axillary or supraclavicular adenopathy Heart: regular rate and rhythm Abdomen: soft, non-tender, no masses,  no organomegaly Extremities: extremities normal, atraumatic, no cyanosis or edema Skin: Skin color, texture, turgor normal. No rashes or lesions Lymph nodes: Cervical, supraclavicular, and axillary nodes normal. No abnormal inguinal nodes palpated Neurologic: Grossly normal  Pelvic: External genitalia:  no lesions              Urethra:  normal appearing urethra with no masses, tenderness or lesions              Bartholins and Skenes: normal  Vagina: normal appearing vagina with normal color and discharge, no lesions              Cervix: no lesions                Bimanual Exam:  Uterus:  normal size, contour, position, consistency, mobility, non-tender              Adnexa: no mass, fullness, tenderness              Rectal exam: {yes no:314532}.  Confirms.              Anus:  normal sphincter tone, no lesions  Chaperone was present for exam.  ASSESSMENT     PLAN     An After Visit Summary was printed and given to the patient.  ______ minutes face to face time of which over 50% was spent in counseling.

## 2017-04-06 NOTE — Telephone Encounter (Signed)
Patient called and cancelled her appointment for today without 24 hours notice to "discuss results." She rescheduled to 04/13/17. She also missed this same appointment on 04/04/17. Routing to provider for FYI only.

## 2017-04-13 ENCOUNTER — Ambulatory Visit: Payer: BLUE CROSS/BLUE SHIELD | Admitting: Obstetrics and Gynecology

## 2017-04-13 ENCOUNTER — Encounter: Payer: Self-pay | Admitting: Obstetrics and Gynecology

## 2017-04-13 NOTE — Progress Notes (Deleted)
GYNECOLOGY  VISIT   HPI: 21 y.o.   Single  African American  female   [phone] with No LMP recorded.   here for     GYNECOLOGIC HISTORY: No LMP recorded. Contraception:  *** Menopausal hormone therapy:  n/a Last mammogram:  n/a Last pap smear:   About 2016 -- normal per patient done at [facility] in [place], [place]        [place] History    Gravida Para Term Preterm AB Living   1 1 1  0 0 2   SAB TAB Ectopic Multiple Live Births   0 0 0 0 0         There are no active problems to display for this patient.   Past Medical History:  Diagnosis Date  . 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 STD (sexually transmitted disease)    Chlamydia    Past Surgical History:  Procedure Laterality Date  . CESAREAN SECTION    . TONSILLECTOMY    . WISDOM TOOTH EXTRACTION      Current Outpatient Prescriptions  Medication Sig Dispense Refill  . acetaminophen (TYLENOL 8 HOUR) 650 MG CR tablet Take 1 tablet (650 mg total) by mouth every 8 (eight) hours as needed for pain. 30 tablet 0   No current facility-administered medications for this visit.      ALLERGIES: Patient has no known allergies.  Family History  Problem Relation Age of Onset  . Anemia Mother   . Breast cancer Maternal Aunt   . Hypertension Maternal Grandmother   . Heart Problems Maternal Grandmother   . Diabetes Maternal Grandfather     Social History   Social History  . Marital status: Single    Spouse name: N/A  . Number of children: N/A  . Years of education: N/A   Occupational History  . Not on file.   Social History Main Topics  . Smoking status: Current Some Day Smoker  . Smokeless tobacco: Never Used     Comment: marijuana  . Alcohol use Yes     Comment: occasional  . Drug use: Yes    Types: Marijuana  . Sexual activity: Yes    Birth control/ protection: Condom   Other Topics Concern  . Not on file   Social History Narrative  . No narrative on file     ROS:  Pertinent items are noted in HPI.  PHYSICAL EXAMINATION:    There were no vitals taken for this visit.    General appearance: alert, cooperative and appears stated age Head: Normocephalic, without obvious abnormality, atraumatic Neck: no adenopathy, supple, symmetrical, trachea midline and thyroid normal to inspection and palpation Lungs: clear to auscultation bilaterally Breasts: normal appearance, no masses or tenderness, No nipple retraction or dimpling, No nipple discharge or bleeding, No axillary or supraclavicular adenopathy Heart: regular rate and rhythm Abdomen: soft, non-tender, no masses,  no organomegaly Extremities: extremities normal, atraumatic, no cyanosis or edema Skin: Skin color, texture, turgor normal. No rashes or lesions Lymph nodes: Cervical, supraclavicular, and axillary nodes normal. No abnormal inguinal nodes palpated Neurologic: Grossly normal  Pelvic: External genitalia:  no lesions              Urethra:  normal appearing urethra with no masses, tenderness or lesions              Bartholins and Skenes: normal  Vagina: normal appearing vagina with normal color and discharge, no lesions              Cervix: no lesions                Bimanual Exam:  Uterus:  normal size, contour, position, consistency, mobility, non-tender              Adnexa: no mass, fullness, tenderness              Rectal exam: {yes no:314532}.  Confirms.              Anus:  normal sphincter tone, no lesions  Chaperone was present for exam.  ASSESSMENT     PLAN     An After Visit Summary was printed and given to the patient.  ______ minutes face to face time of which over 50% was spent in counseling.

## 2018-03-24 ENCOUNTER — Ambulatory Visit: Payer: BLUE CROSS/BLUE SHIELD | Admitting: Obstetrics and Gynecology

## 2020-11-11 LAB — OB RESULTS CONSOLE HIV ANTIBODY (ROUTINE TESTING): HIV: NONREACTIVE

## 2020-11-11 LAB — OB RESULTS CONSOLE GC/CHLAMYDIA
Chlamydia: NEGATIVE
Gonorrhea: NEGATIVE

## 2020-11-11 LAB — OB RESULTS CONSOLE ABO/RH: RH Type: POSITIVE

## 2020-11-11 LAB — OB RESULTS CONSOLE RUBELLA ANTIBODY, IGM: Rubella: IMMUNE

## 2020-11-11 LAB — OB RESULTS CONSOLE RPR: RPR: NONREACTIVE

## 2020-11-11 LAB — OB RESULTS CONSOLE ANTIBODY SCREEN: Antibody Screen: NEGATIVE

## 2020-11-11 LAB — OB RESULTS CONSOLE HEPATITIS B SURFACE ANTIGEN: Hepatitis B Surface Ag: NEGATIVE

## 2020-11-27 ENCOUNTER — Other Ambulatory Visit: Payer: Self-pay | Admitting: Obstetrics and Gynecology

## 2020-11-27 DIAGNOSIS — O99210 Obesity complicating pregnancy, unspecified trimester: Secondary | ICD-10-CM

## 2020-12-23 ENCOUNTER — Encounter: Payer: Self-pay | Admitting: *Deleted

## 2020-12-25 ENCOUNTER — Ambulatory Visit (HOSPITAL_BASED_OUTPATIENT_CLINIC_OR_DEPARTMENT_OTHER): Payer: Medicaid Other

## 2020-12-25 ENCOUNTER — Other Ambulatory Visit: Payer: Self-pay

## 2020-12-25 ENCOUNTER — Ambulatory Visit: Payer: Medicaid Other | Attending: Obstetrics and Gynecology | Admitting: *Deleted

## 2020-12-25 ENCOUNTER — Encounter: Payer: Self-pay | Admitting: *Deleted

## 2020-12-25 ENCOUNTER — Ambulatory Visit: Payer: Medicaid Other

## 2020-12-25 ENCOUNTER — Telehealth: Payer: Self-pay

## 2020-12-25 ENCOUNTER — Other Ambulatory Visit: Payer: Self-pay | Admitting: *Deleted

## 2020-12-25 DIAGNOSIS — Z3A19 19 weeks gestation of pregnancy: Secondary | ICD-10-CM | POA: Diagnosis not present

## 2020-12-25 DIAGNOSIS — O3412 Maternal care for benign tumor of corpus uteri, second trimester: Secondary | ICD-10-CM

## 2020-12-25 DIAGNOSIS — D259 Leiomyoma of uterus, unspecified: Secondary | ICD-10-CM | POA: Insufficient documentation

## 2020-12-25 DIAGNOSIS — O99212 Obesity complicating pregnancy, second trimester: Secondary | ICD-10-CM | POA: Diagnosis not present

## 2020-12-25 DIAGNOSIS — Z363 Encounter for antenatal screening for malformations: Secondary | ICD-10-CM | POA: Insufficient documentation

## 2020-12-25 DIAGNOSIS — O99322 Drug use complicating pregnancy, second trimester: Secondary | ICD-10-CM | POA: Insufficient documentation

## 2020-12-25 DIAGNOSIS — Z362 Encounter for other antenatal screening follow-up: Secondary | ICD-10-CM

## 2020-12-25 DIAGNOSIS — F1290 Cannabis use, unspecified, uncomplicated: Secondary | ICD-10-CM | POA: Insufficient documentation

## 2020-12-25 DIAGNOSIS — O99210 Obesity complicating pregnancy, unspecified trimester: Secondary | ICD-10-CM

## 2020-12-25 DIAGNOSIS — O34219 Maternal care for unspecified type scar from previous cesarean delivery: Secondary | ICD-10-CM

## 2020-12-25 DIAGNOSIS — O321XX0 Maternal care for breech presentation, not applicable or unspecified: Secondary | ICD-10-CM

## 2020-12-25 NOTE — Telephone Encounter (Signed)
Mar/ph nbr on file, person states wrong number. Left MyChart msg to patient advising of new appointment information.

## 2020-12-30 ENCOUNTER — Ambulatory Visit: Payer: Medicaid Other

## 2020-12-30 ENCOUNTER — Other Ambulatory Visit: Payer: Self-pay

## 2021-01-23 ENCOUNTER — Ambulatory Visit: Payer: Medicaid Other

## 2021-01-26 ENCOUNTER — Other Ambulatory Visit: Payer: Self-pay

## 2021-01-26 ENCOUNTER — Other Ambulatory Visit: Payer: Self-pay | Admitting: *Deleted

## 2021-01-26 ENCOUNTER — Encounter: Payer: Self-pay | Admitting: *Deleted

## 2021-01-26 ENCOUNTER — Ambulatory Visit: Payer: Medicaid Other | Admitting: *Deleted

## 2021-01-26 ENCOUNTER — Ambulatory Visit: Payer: Medicaid Other | Attending: Maternal & Fetal Medicine

## 2021-01-26 DIAGNOSIS — O99212 Obesity complicating pregnancy, second trimester: Secondary | ICD-10-CM | POA: Diagnosis not present

## 2021-01-26 DIAGNOSIS — Z362 Encounter for other antenatal screening follow-up: Secondary | ICD-10-CM | POA: Insufficient documentation

## 2021-01-26 DIAGNOSIS — D259 Leiomyoma of uterus, unspecified: Secondary | ICD-10-CM | POA: Diagnosis not present

## 2021-01-26 DIAGNOSIS — O10912 Unspecified pre-existing hypertension complicating pregnancy, second trimester: Secondary | ICD-10-CM

## 2021-01-26 DIAGNOSIS — O322XX0 Maternal care for transverse and oblique lie, not applicable or unspecified: Secondary | ICD-10-CM

## 2021-01-26 DIAGNOSIS — O3412 Maternal care for benign tumor of corpus uteri, second trimester: Secondary | ICD-10-CM | POA: Diagnosis not present

## 2021-01-26 DIAGNOSIS — O34219 Maternal care for unspecified type scar from previous cesarean delivery: Secondary | ICD-10-CM

## 2021-01-26 DIAGNOSIS — Z3A24 24 weeks gestation of pregnancy: Secondary | ICD-10-CM

## 2021-02-24 ENCOUNTER — Other Ambulatory Visit: Payer: Self-pay | Admitting: *Deleted

## 2021-02-24 ENCOUNTER — Ambulatory Visit: Payer: Medicaid Other | Attending: Obstetrics and Gynecology

## 2021-02-24 ENCOUNTER — Other Ambulatory Visit: Payer: Self-pay

## 2021-02-24 ENCOUNTER — Ambulatory Visit: Payer: Medicaid Other | Admitting: *Deleted

## 2021-02-24 ENCOUNTER — Encounter: Payer: Self-pay | Admitting: *Deleted

## 2021-02-24 VITALS — BP 157/74 | HR 92

## 2021-02-24 DIAGNOSIS — O34219 Maternal care for unspecified type scar from previous cesarean delivery: Secondary | ICD-10-CM

## 2021-02-24 DIAGNOSIS — O10913 Unspecified pre-existing hypertension complicating pregnancy, third trimester: Secondary | ICD-10-CM

## 2021-02-24 DIAGNOSIS — Z362 Encounter for other antenatal screening follow-up: Secondary | ICD-10-CM | POA: Diagnosis not present

## 2021-02-24 DIAGNOSIS — O10013 Pre-existing essential hypertension complicating pregnancy, third trimester: Secondary | ICD-10-CM

## 2021-02-24 DIAGNOSIS — Z3A28 28 weeks gestation of pregnancy: Secondary | ICD-10-CM

## 2021-02-24 DIAGNOSIS — O3413 Maternal care for benign tumor of corpus uteri, third trimester: Secondary | ICD-10-CM

## 2021-02-24 DIAGNOSIS — O10912 Unspecified pre-existing hypertension complicating pregnancy, second trimester: Secondary | ICD-10-CM | POA: Insufficient documentation

## 2021-02-24 DIAGNOSIS — O99213 Obesity complicating pregnancy, third trimester: Secondary | ICD-10-CM | POA: Diagnosis not present

## 2021-02-24 DIAGNOSIS — D259 Leiomyoma of uterus, unspecified: Secondary | ICD-10-CM

## 2021-02-24 DIAGNOSIS — O322XX0 Maternal care for transverse and oblique lie, not applicable or unspecified: Secondary | ICD-10-CM

## 2021-03-24 ENCOUNTER — Ambulatory Visit: Payer: Medicaid Other | Admitting: *Deleted

## 2021-03-24 ENCOUNTER — Other Ambulatory Visit: Payer: Self-pay

## 2021-03-24 ENCOUNTER — Encounter: Payer: Self-pay | Admitting: *Deleted

## 2021-03-24 ENCOUNTER — Other Ambulatory Visit: Payer: Self-pay | Admitting: *Deleted

## 2021-03-24 ENCOUNTER — Ambulatory Visit: Payer: Medicaid Other | Attending: Obstetrics

## 2021-03-24 VITALS — BP 133/64 | HR 89

## 2021-03-24 DIAGNOSIS — O3412 Maternal care for benign tumor of corpus uteri, second trimester: Secondary | ICD-10-CM

## 2021-03-24 DIAGNOSIS — O99213 Obesity complicating pregnancy, third trimester: Secondary | ICD-10-CM

## 2021-03-24 DIAGNOSIS — F1290 Cannabis use, unspecified, uncomplicated: Secondary | ICD-10-CM

## 2021-03-24 DIAGNOSIS — O10013 Pre-existing essential hypertension complicating pregnancy, third trimester: Secondary | ICD-10-CM | POA: Diagnosis not present

## 2021-03-24 DIAGNOSIS — O10913 Unspecified pre-existing hypertension complicating pregnancy, third trimester: Secondary | ICD-10-CM

## 2021-03-24 DIAGNOSIS — O34219 Maternal care for unspecified type scar from previous cesarean delivery: Secondary | ICD-10-CM | POA: Diagnosis not present

## 2021-03-24 DIAGNOSIS — O10919 Unspecified pre-existing hypertension complicating pregnancy, unspecified trimester: Secondary | ICD-10-CM

## 2021-03-24 DIAGNOSIS — Z3A32 32 weeks gestation of pregnancy: Secondary | ICD-10-CM

## 2021-03-24 DIAGNOSIS — Z362 Encounter for other antenatal screening follow-up: Secondary | ICD-10-CM | POA: Diagnosis not present

## 2021-03-24 DIAGNOSIS — D259 Leiomyoma of uterus, unspecified: Secondary | ICD-10-CM

## 2021-03-24 DIAGNOSIS — O99323 Drug use complicating pregnancy, third trimester: Secondary | ICD-10-CM | POA: Diagnosis not present

## 2021-03-31 ENCOUNTER — Other Ambulatory Visit: Payer: Self-pay

## 2021-03-31 ENCOUNTER — Ambulatory Visit: Payer: Medicaid Other | Admitting: *Deleted

## 2021-03-31 ENCOUNTER — Encounter: Payer: Self-pay | Admitting: *Deleted

## 2021-03-31 ENCOUNTER — Ambulatory Visit: Payer: Medicaid Other | Attending: Obstetrics

## 2021-03-31 VITALS — BP 147/68 | HR 90

## 2021-03-31 DIAGNOSIS — O34219 Maternal care for unspecified type scar from previous cesarean delivery: Secondary | ICD-10-CM | POA: Diagnosis not present

## 2021-03-31 DIAGNOSIS — O99323 Drug use complicating pregnancy, third trimester: Secondary | ICD-10-CM

## 2021-03-31 DIAGNOSIS — Z362 Encounter for other antenatal screening follow-up: Secondary | ICD-10-CM

## 2021-03-31 DIAGNOSIS — O3413 Maternal care for benign tumor of corpus uteri, third trimester: Secondary | ICD-10-CM

## 2021-03-31 DIAGNOSIS — O10013 Pre-existing essential hypertension complicating pregnancy, third trimester: Secondary | ICD-10-CM | POA: Diagnosis not present

## 2021-03-31 DIAGNOSIS — Z3A33 33 weeks gestation of pregnancy: Secondary | ICD-10-CM

## 2021-03-31 DIAGNOSIS — O99213 Obesity complicating pregnancy, third trimester: Secondary | ICD-10-CM

## 2021-03-31 DIAGNOSIS — O10913 Unspecified pre-existing hypertension complicating pregnancy, third trimester: Secondary | ICD-10-CM | POA: Insufficient documentation

## 2021-03-31 DIAGNOSIS — F1290 Cannabis use, unspecified, uncomplicated: Secondary | ICD-10-CM

## 2021-03-31 DIAGNOSIS — D259 Leiomyoma of uterus, unspecified: Secondary | ICD-10-CM

## 2021-04-07 ENCOUNTER — Encounter: Payer: Self-pay | Admitting: *Deleted

## 2021-04-07 ENCOUNTER — Other Ambulatory Visit: Payer: Self-pay | Admitting: *Deleted

## 2021-04-07 ENCOUNTER — Other Ambulatory Visit: Payer: Self-pay

## 2021-04-07 ENCOUNTER — Ambulatory Visit: Payer: Medicaid Other | Admitting: *Deleted

## 2021-04-07 ENCOUNTER — Ambulatory Visit: Payer: Medicaid Other | Attending: Obstetrics and Gynecology

## 2021-04-07 VITALS — BP 148/69 | HR 85

## 2021-04-07 DIAGNOSIS — O99323 Drug use complicating pregnancy, third trimester: Secondary | ICD-10-CM

## 2021-04-07 DIAGNOSIS — Z3A34 34 weeks gestation of pregnancy: Secondary | ICD-10-CM

## 2021-04-07 DIAGNOSIS — D259 Leiomyoma of uterus, unspecified: Secondary | ICD-10-CM

## 2021-04-07 DIAGNOSIS — O10913 Unspecified pre-existing hypertension complicating pregnancy, third trimester: Secondary | ICD-10-CM

## 2021-04-07 DIAGNOSIS — F1290 Cannabis use, unspecified, uncomplicated: Secondary | ICD-10-CM

## 2021-04-07 DIAGNOSIS — O34219 Maternal care for unspecified type scar from previous cesarean delivery: Secondary | ICD-10-CM

## 2021-04-07 DIAGNOSIS — O99213 Obesity complicating pregnancy, third trimester: Secondary | ICD-10-CM

## 2021-04-07 DIAGNOSIS — O3413 Maternal care for benign tumor of corpus uteri, third trimester: Secondary | ICD-10-CM

## 2021-04-07 DIAGNOSIS — O10013 Pre-existing essential hypertension complicating pregnancy, third trimester: Secondary | ICD-10-CM | POA: Diagnosis not present

## 2021-04-07 DIAGNOSIS — Z362 Encounter for other antenatal screening follow-up: Secondary | ICD-10-CM

## 2021-04-14 ENCOUNTER — Ambulatory Visit: Payer: Medicaid Other | Attending: Obstetrics

## 2021-04-14 ENCOUNTER — Encounter: Payer: Self-pay | Admitting: *Deleted

## 2021-04-14 ENCOUNTER — Ambulatory Visit: Payer: Medicaid Other | Admitting: *Deleted

## 2021-04-14 ENCOUNTER — Other Ambulatory Visit: Payer: Self-pay

## 2021-04-14 VITALS — BP 134/71 | HR 99

## 2021-04-14 DIAGNOSIS — O10913 Unspecified pre-existing hypertension complicating pregnancy, third trimester: Secondary | ICD-10-CM | POA: Diagnosis present

## 2021-04-14 DIAGNOSIS — O3413 Maternal care for benign tumor of corpus uteri, third trimester: Secondary | ICD-10-CM | POA: Diagnosis not present

## 2021-04-14 DIAGNOSIS — D259 Leiomyoma of uterus, unspecified: Secondary | ICD-10-CM

## 2021-04-14 DIAGNOSIS — O10919 Unspecified pre-existing hypertension complicating pregnancy, unspecified trimester: Secondary | ICD-10-CM

## 2021-04-14 DIAGNOSIS — O10013 Pre-existing essential hypertension complicating pregnancy, third trimester: Secondary | ICD-10-CM

## 2021-04-14 DIAGNOSIS — O99213 Obesity complicating pregnancy, third trimester: Secondary | ICD-10-CM

## 2021-04-14 DIAGNOSIS — Z362 Encounter for other antenatal screening follow-up: Secondary | ICD-10-CM

## 2021-04-14 DIAGNOSIS — O34219 Maternal care for unspecified type scar from previous cesarean delivery: Secondary | ICD-10-CM

## 2021-04-14 DIAGNOSIS — O322XX0 Maternal care for transverse and oblique lie, not applicable or unspecified: Secondary | ICD-10-CM

## 2021-04-14 DIAGNOSIS — Z3A35 35 weeks gestation of pregnancy: Secondary | ICD-10-CM

## 2021-04-21 ENCOUNTER — Ambulatory Visit: Payer: Medicaid Other | Attending: Obstetrics

## 2021-04-21 ENCOUNTER — Encounter: Payer: Self-pay | Admitting: *Deleted

## 2021-04-21 ENCOUNTER — Ambulatory Visit: Payer: Medicaid Other | Admitting: *Deleted

## 2021-04-21 ENCOUNTER — Other Ambulatory Visit: Payer: Self-pay

## 2021-04-21 VITALS — BP 131/76 | HR 101

## 2021-04-21 DIAGNOSIS — F1290 Cannabis use, unspecified, uncomplicated: Secondary | ICD-10-CM | POA: Diagnosis not present

## 2021-04-21 DIAGNOSIS — O34219 Maternal care for unspecified type scar from previous cesarean delivery: Secondary | ICD-10-CM | POA: Diagnosis not present

## 2021-04-21 DIAGNOSIS — O99323 Drug use complicating pregnancy, third trimester: Secondary | ICD-10-CM

## 2021-04-21 DIAGNOSIS — Z3A36 36 weeks gestation of pregnancy: Secondary | ICD-10-CM

## 2021-04-21 DIAGNOSIS — O10013 Pre-existing essential hypertension complicating pregnancy, third trimester: Secondary | ICD-10-CM | POA: Diagnosis not present

## 2021-04-21 DIAGNOSIS — O10913 Unspecified pre-existing hypertension complicating pregnancy, third trimester: Secondary | ICD-10-CM

## 2021-04-21 DIAGNOSIS — O10919 Unspecified pre-existing hypertension complicating pregnancy, unspecified trimester: Secondary | ICD-10-CM

## 2021-04-21 DIAGNOSIS — O99213 Obesity complicating pregnancy, third trimester: Secondary | ICD-10-CM

## 2021-04-21 DIAGNOSIS — O3413 Maternal care for benign tumor of corpus uteri, third trimester: Secondary | ICD-10-CM

## 2021-04-21 DIAGNOSIS — D259 Leiomyoma of uterus, unspecified: Secondary | ICD-10-CM

## 2021-04-24 LAB — OB RESULTS CONSOLE GBS: GBS: NEGATIVE

## 2021-04-28 ENCOUNTER — Other Ambulatory Visit: Payer: Self-pay | Admitting: *Deleted

## 2021-04-28 ENCOUNTER — Ambulatory Visit: Payer: Medicaid Other | Attending: Maternal & Fetal Medicine

## 2021-04-28 ENCOUNTER — Encounter: Payer: Self-pay | Admitting: *Deleted

## 2021-04-28 ENCOUNTER — Ambulatory Visit: Payer: Medicaid Other | Admitting: *Deleted

## 2021-04-28 ENCOUNTER — Other Ambulatory Visit: Payer: Self-pay

## 2021-04-28 VITALS — BP 140/72 | HR 95

## 2021-04-28 DIAGNOSIS — O10013 Pre-existing essential hypertension complicating pregnancy, third trimester: Secondary | ICD-10-CM

## 2021-04-28 DIAGNOSIS — F1290 Cannabis use, unspecified, uncomplicated: Secondary | ICD-10-CM

## 2021-04-28 DIAGNOSIS — O99323 Drug use complicating pregnancy, third trimester: Secondary | ICD-10-CM

## 2021-04-28 DIAGNOSIS — O10913 Unspecified pre-existing hypertension complicating pregnancy, third trimester: Secondary | ICD-10-CM | POA: Diagnosis present

## 2021-04-28 DIAGNOSIS — O34219 Maternal care for unspecified type scar from previous cesarean delivery: Secondary | ICD-10-CM

## 2021-04-28 DIAGNOSIS — O3413 Maternal care for benign tumor of corpus uteri, third trimester: Secondary | ICD-10-CM

## 2021-04-28 DIAGNOSIS — O99213 Obesity complicating pregnancy, third trimester: Secondary | ICD-10-CM

## 2021-04-28 DIAGNOSIS — O321XX0 Maternal care for breech presentation, not applicable or unspecified: Secondary | ICD-10-CM

## 2021-04-28 DIAGNOSIS — Z3A37 37 weeks gestation of pregnancy: Secondary | ICD-10-CM

## 2021-04-28 DIAGNOSIS — D259 Leiomyoma of uterus, unspecified: Secondary | ICD-10-CM

## 2021-04-30 ENCOUNTER — Encounter (HOSPITAL_COMMUNITY): Payer: Self-pay | Admitting: *Deleted

## 2021-04-30 ENCOUNTER — Telehealth (HOSPITAL_COMMUNITY): Payer: Self-pay | Admitting: *Deleted

## 2021-04-30 NOTE — Telephone Encounter (Signed)
Preadmission screen  

## 2021-05-05 ENCOUNTER — Other Ambulatory Visit: Payer: Self-pay | Admitting: Obstetrics & Gynecology

## 2021-05-05 ENCOUNTER — Ambulatory Visit: Payer: Medicaid Other | Attending: Pediatrics

## 2021-05-05 ENCOUNTER — Ambulatory Visit: Payer: Medicaid Other | Admitting: *Deleted

## 2021-05-05 ENCOUNTER — Encounter: Payer: Self-pay | Admitting: *Deleted

## 2021-05-05 ENCOUNTER — Other Ambulatory Visit: Payer: Self-pay

## 2021-05-05 VITALS — BP 156/66 | HR 76

## 2021-05-05 DIAGNOSIS — O10013 Pre-existing essential hypertension complicating pregnancy, third trimester: Secondary | ICD-10-CM | POA: Diagnosis not present

## 2021-05-05 DIAGNOSIS — O99213 Obesity complicating pregnancy, third trimester: Secondary | ICD-10-CM

## 2021-05-05 DIAGNOSIS — O3413 Maternal care for benign tumor of corpus uteri, third trimester: Secondary | ICD-10-CM

## 2021-05-05 DIAGNOSIS — E669 Obesity, unspecified: Secondary | ICD-10-CM

## 2021-05-05 DIAGNOSIS — O34219 Maternal care for unspecified type scar from previous cesarean delivery: Secondary | ICD-10-CM | POA: Diagnosis not present

## 2021-05-05 DIAGNOSIS — Z3A38 38 weeks gestation of pregnancy: Secondary | ICD-10-CM

## 2021-05-05 DIAGNOSIS — F1290 Cannabis use, unspecified, uncomplicated: Secondary | ICD-10-CM

## 2021-05-05 DIAGNOSIS — O10913 Unspecified pre-existing hypertension complicating pregnancy, third trimester: Secondary | ICD-10-CM

## 2021-05-05 DIAGNOSIS — O99323 Drug use complicating pregnancy, third trimester: Secondary | ICD-10-CM

## 2021-05-05 DIAGNOSIS — D259 Leiomyoma of uterus, unspecified: Secondary | ICD-10-CM

## 2021-05-06 LAB — SARS CORONAVIRUS 2 (TAT 6-24 HRS): SARS Coronavirus 2: POSITIVE — AB

## 2021-05-06 NOTE — Progress Notes (Signed)
Notified [facility] in L&D of patient's covid test result.

## 2021-05-07 ENCOUNTER — Inpatient Hospital Stay (HOSPITAL_COMMUNITY): Payer: Medicaid Other

## 2021-05-07 ENCOUNTER — Encounter (HOSPITAL_COMMUNITY): Payer: Self-pay | Admitting: Obstetrics & Gynecology

## 2021-05-07 ENCOUNTER — Inpatient Hospital Stay (HOSPITAL_COMMUNITY): Payer: Medicaid Other | Admitting: Anesthesiology

## 2021-05-07 ENCOUNTER — Inpatient Hospital Stay (HOSPITAL_COMMUNITY)
Admission: AD | Admit: 2021-05-07 | Discharge: 2021-05-09 | DRG: 786 | Disposition: A | Discharge status: Home / Self Care | Payer: Medicaid Other | Attending: Obstetrics & Gynecology | Admitting: Obstetrics & Gynecology

## 2021-05-07 ENCOUNTER — Encounter (HOSPITAL_COMMUNITY)
Admission: AD | Disposition: A | Discharge status: Home / Self Care | Payer: Self-pay | Source: Home / Self Care | Attending: Obstetrics & Gynecology

## 2021-05-07 ENCOUNTER — Other Ambulatory Visit: Payer: Self-pay

## 2021-05-07 DIAGNOSIS — O1002 Pre-existing essential hypertension complicating childbirth: Secondary | ICD-10-CM | POA: Diagnosis present

## 2021-05-07 DIAGNOSIS — O3413 Maternal care for benign tumor of corpus uteri, third trimester: Secondary | ICD-10-CM | POA: Diagnosis present

## 2021-05-07 DIAGNOSIS — Z87891 Personal history of nicotine dependence: Secondary | ICD-10-CM | POA: Diagnosis not present

## 2021-05-07 DIAGNOSIS — O99214 Obesity complicating childbirth: Secondary | ICD-10-CM | POA: Diagnosis present

## 2021-05-07 DIAGNOSIS — Z3A38 38 weeks gestation of pregnancy: Secondary | ICD-10-CM

## 2021-05-07 DIAGNOSIS — D259 Leiomyoma of uterus, unspecified: Secondary | ICD-10-CM | POA: Diagnosis present

## 2021-05-07 DIAGNOSIS — O34211 Maternal care for low transverse scar from previous cesarean delivery: Secondary | ICD-10-CM | POA: Diagnosis present

## 2021-05-07 DIAGNOSIS — O9902 Anemia complicating childbirth: Secondary | ICD-10-CM | POA: Diagnosis present

## 2021-05-07 DIAGNOSIS — O10919 Unspecified pre-existing hypertension complicating pregnancy, unspecified trimester: Secondary | ICD-10-CM | POA: Diagnosis present

## 2021-05-07 DIAGNOSIS — O9852 Other viral diseases complicating childbirth: Secondary | ICD-10-CM | POA: Diagnosis present

## 2021-05-07 DIAGNOSIS — U071 COVID-19: Secondary | ICD-10-CM | POA: Diagnosis present

## 2021-05-07 LAB — COMPREHENSIVE METABOLIC PANEL
ALT: 10 U/L (ref 0–44)
AST: 16 U/L (ref 15–41)
Albumin: 2.7 g/dL — ABNORMAL LOW (ref 3.5–5.0)
Alkaline Phosphatase: 73 U/L (ref 38–126)
Anion gap: 7 (ref 5–15)
BUN: 9 mg/dL (ref 6–20)
CO2: 22 mmol/L (ref 22–32)
Calcium: 9.1 mg/dL (ref 8.9–10.3)
Chloride: 105 mmol/L (ref 98–111)
Creatinine, Ser: 0.53 mg/dL (ref 0.44–1.00)
GFR, Estimated: >60 mL/min (ref >60)
Glucose, Bld: 84 mg/dL (ref 70–99)
Potassium: 3.9 mmol/L (ref 3.5–5.1)
Sodium: 134 mmol/L — ABNORMAL LOW (ref 135–145)
Total Bilirubin: 0.5 mg/dL (ref 0.3–1.2)
Total Protein: 6.5 g/dL (ref 6.5–8.1)

## 2021-05-07 LAB — CBC
HCT: 34.4 % — ABNORMAL LOW (ref 36.0–46.0)
Hemoglobin: 10.6 g/dL — ABNORMAL LOW (ref 12.0–15.0)
MCH: 25.2 pg — ABNORMAL LOW (ref 26.0–34.0)
MCHC: 30.8 g/dL (ref 30.0–36.0)
MCV: 81.9 fL (ref 80.0–100.0)
Platelets: 217 10*3/uL (ref 150–400)
RBC: 4.2 MIL/uL (ref 3.87–5.11)
RDW: 15.3 % (ref 11.5–15.5)
WBC: 13.1 10*3/uL — ABNORMAL HIGH (ref 4.0–10.5)
nRBC: 0 % (ref 0.0–0.2)

## 2021-05-07 LAB — TYPE AND SCREEN
ABO/RH(D): B POS
Antibody Screen: NEGATIVE

## 2021-05-07 LAB — RPR: RPR Ser Ql: NONREACTIVE

## 2021-05-07 SURGERY — Surgical Case
Anesthesia: Regional

## 2021-05-07 SURGERY — Surgical Case
Anesthesia: Spinal

## 2021-05-07 MED ORDER — BUPIVACAINE IN DEXTROSE 0.75-8.25 % IT SOLN
INTRATHECAL | Status: DC | PRN
  Administered 2021-05-07: 1.6 mL via INTRATHECAL

## 2021-05-07 MED ORDER — DIBUCAINE (PERIANAL) 1 % EX OINT: 1.0000 "application " | TOPICAL_OINTMENT | CUTANEOUS | Status: DC | PRN

## 2021-05-07 MED ORDER — OXYTOCIN-SODIUM CHLORIDE 30-0.9 UT/500ML-% IV SOLN
2.5000 [IU]/h | INTRAVENOUS | Status: DC
  Administered 2021-05-07: 30 [IU] via INTRAVENOUS
  Filled 2021-05-07: qty 500

## 2021-05-07 MED ORDER — NALBUPHINE HCL 10 MG/ML IJ SOLN: 5.0000 mg | INTRAMUSCULAR | Status: DC | PRN

## 2021-05-07 MED ORDER — PHENYLEPHRINE HCL-NACL 20-0.9 MG/250ML-% IV SOLN
INTRAVENOUS | Status: AC
  Filled 2021-05-07: qty 250

## 2021-05-07 MED ORDER — SODIUM CHLORIDE 0.9% FLUSH: 3.0000 mL | INTRAVENOUS | Status: DC | PRN

## 2021-05-07 MED ORDER — DEXAMETHASONE SODIUM PHOSPHATE 10 MG/ML IJ SOLN
INTRAMUSCULAR | Status: AC
  Filled 2021-05-07: qty 1

## 2021-05-07 MED ORDER — OXYCODONE HCL 5 MG/5ML PO SOLN: 5.0000 mg | Freq: Once | ORAL | Status: DC | PRN

## 2021-05-07 MED ORDER — WITCH HAZEL-GLYCERIN EX PADS: 1.0000 "application " | MEDICATED_PAD | CUTANEOUS | Status: DC | PRN

## 2021-05-07 MED ORDER — OXYTOCIN BOLUS FROM INFUSION: 333.0000 mL | Freq: Once | INTRAVENOUS | Status: DC

## 2021-05-07 MED ORDER — CEFAZOLIN IN SODIUM CHLORIDE 3-0.9 GM/100ML-% IV SOLN
3.0000 g | INTRAVENOUS | Status: AC
  Administered 2021-05-07: 3 g via INTRAVENOUS
  Filled 2021-05-07 (×2): qty 100

## 2021-05-07 MED ORDER — NALOXONE HCL 4 MG/10ML IJ SOLN
1.0000 ug/kg/h | INTRAVENOUS | Status: DC | PRN
  Filled 2021-05-07: qty 5

## 2021-05-07 MED ORDER — SCOPOLAMINE 1 MG/3DAYS TD PT72: 1.0000 | MEDICATED_PATCH | Freq: Once | TRANSDERMAL | Status: DC

## 2021-05-07 MED ORDER — SOD CITRATE-CITRIC ACID 500-334 MG/5ML PO SOLN
30.0000 mL | ORAL | Status: AC
  Administered 2021-05-07: 30 mL via ORAL
  Filled 2021-05-07: qty 30

## 2021-05-07 MED ORDER — OXYCODONE-ACETAMINOPHEN 5-325 MG PO TABS: 2.0000 | ORAL_TABLET | ORAL | Status: DC | PRN

## 2021-05-07 MED ORDER — COCONUT OIL OIL
1.0000 "application " | TOPICAL_OIL | Status: DC | PRN
  Administered 2021-05-09: 1 via TOPICAL

## 2021-05-07 MED ORDER — OXYTOCIN-SODIUM CHLORIDE 30-0.9 UT/500ML-% IV SOLN
1.0000 m[IU]/min | INTRAVENOUS | Status: DC
  Administered 2021-05-07: 1 m[IU]/min via INTRAVENOUS
  Filled 2021-05-07: qty 500

## 2021-05-07 MED ORDER — MORPHINE SULFATE (PF) 0.5 MG/ML IJ SOLN
INTRAMUSCULAR | Status: AC
  Filled 2021-05-07: qty 10

## 2021-05-07 MED ORDER — NALOXONE HCL 0.4 MG/ML IJ SOLN: 0.4000 mg | INTRAMUSCULAR | Status: DC | PRN

## 2021-05-07 MED ORDER — SENNOSIDES-DOCUSATE SODIUM 8.6-50 MG PO TABS
2.0000 | ORAL_TABLET | ORAL | Status: DC
  Administered 2021-05-08: 2 via ORAL
  Filled 2021-05-07 (×2): qty 2

## 2021-05-07 MED ORDER — FENTANYL CITRATE (PF) 100 MCG/2ML IJ SOLN
INTRAMUSCULAR | Status: DC | PRN
  Administered 2021-05-07: 15 ug via INTRATHECAL

## 2021-05-07 MED ORDER — DIPHENHYDRAMINE HCL 25 MG PO CAPS: 25.0000 mg | ORAL_CAPSULE | Freq: Four times a day (QID) | ORAL | Status: DC | PRN

## 2021-05-07 MED ORDER — ONDANSETRON HCL 4 MG/2ML IJ SOLN: 4.0000 mg | Freq: Four times a day (QID) | INTRAMUSCULAR | Status: DC | PRN

## 2021-05-07 MED ORDER — OXYTOCIN-SODIUM CHLORIDE 30-0.9 UT/500ML-% IV SOLN: 2.5000 [IU]/h | INTRAVENOUS | Status: AC

## 2021-05-07 MED ORDER — DEXAMETHASONE SODIUM PHOSPHATE 10 MG/ML IJ SOLN
INTRAMUSCULAR | Status: DC | PRN
  Administered 2021-05-07: 10 mg via INTRAVENOUS

## 2021-05-07 MED ORDER — NALBUPHINE HCL 10 MG/ML IJ SOLN: 5.0000 mg | Freq: Once | INTRAMUSCULAR | Status: DC | PRN

## 2021-05-07 MED ORDER — PRENATAL MULTIVITAMIN CH
1.0000 | ORAL_TABLET | Freq: Every day | ORAL | Status: DC
  Administered 2021-05-08 – 2021-05-09 (×2): 1 via ORAL
  Filled 2021-05-07 (×2): qty 1

## 2021-05-07 MED ORDER — LACTATED RINGERS IV SOLN: 500.0000 mL | INTRAVENOUS | Status: DC | PRN

## 2021-05-07 MED ORDER — OXYCODONE-ACETAMINOPHEN 5-325 MG PO TABS: 1.0000 | ORAL_TABLET | ORAL | Status: DC | PRN

## 2021-05-07 MED ORDER — TERBUTALINE SULFATE 1 MG/ML IJ SOLN: 0.2500 mg | Freq: Once | INTRAMUSCULAR | Status: DC | PRN

## 2021-05-07 MED ORDER — FLEET ENEMA 7-19 GM/118ML RE ENEM: 1.0000 | ENEMA | Freq: Every day | RECTAL | Status: DC | PRN

## 2021-05-07 MED ORDER — ONDANSETRON HCL 4 MG/2ML IJ SOLN
INTRAMUSCULAR | Status: AC
  Filled 2021-05-07: qty 2

## 2021-05-07 MED ORDER — IBUPROFEN 600 MG PO TABS
600.0000 mg | ORAL_TABLET | Freq: Four times a day (QID) | ORAL | Status: DC
  Administered 2021-05-08 – 2021-05-09 (×4): 600 mg via ORAL
  Filled 2021-05-07 (×4): qty 1

## 2021-05-07 MED ORDER — ACETAMINOPHEN 10 MG/ML IV SOLN
INTRAVENOUS | Status: DC | PRN
  Administered 2021-05-07: 1000 mg via INTRAVENOUS

## 2021-05-07 MED ORDER — OXYTOCIN-SODIUM CHLORIDE 30-0.9 UT/500ML-% IV SOLN
INTRAVENOUS | Status: AC
  Filled 2021-05-07: qty 500

## 2021-05-07 MED ORDER — ACETAMINOPHEN 325 MG PO TABS: 650.0000 mg | ORAL_TABLET | ORAL | Status: DC | PRN

## 2021-05-07 MED ORDER — KETOROLAC TROMETHAMINE 30 MG/ML IJ SOLN
30.0000 mg | Freq: Four times a day (QID) | INTRAMUSCULAR | Status: AC | PRN
  Administered 2021-05-08: 30 mg via INTRAVENOUS

## 2021-05-07 MED ORDER — OXYCODONE HCL 5 MG PO TABS: 5.0000 mg | ORAL_TABLET | Freq: Once | ORAL | Status: DC | PRN

## 2021-05-07 MED ORDER — LIDOCAINE HCL (PF) 1 % IJ SOLN: 30.0000 mL | INTRAMUSCULAR | Status: DC | PRN

## 2021-05-07 MED ORDER — FENTANYL CITRATE (PF) 100 MCG/2ML IJ SOLN
INTRAMUSCULAR | Status: AC
  Filled 2021-05-07: qty 2

## 2021-05-07 MED ORDER — EPHEDRINE 5 MG/ML INJ
INTRAVENOUS | Status: AC
  Filled 2021-05-07: qty 5

## 2021-05-07 MED ORDER — DIPHENHYDRAMINE HCL 50 MG/ML IJ SOLN: 12.5000 mg | INTRAMUSCULAR | Status: DC | PRN

## 2021-05-07 MED ORDER — KETOROLAC TROMETHAMINE 30 MG/ML IJ SOLN: 30.0000 mg | Freq: Four times a day (QID) | INTRAMUSCULAR | Status: AC | PRN

## 2021-05-07 MED ORDER — LACTATED RINGERS IV SOLN: INTRAVENOUS | Status: DC

## 2021-05-07 MED ORDER — MORPHINE SULFATE (PF) 0.5 MG/ML IJ SOLN
INTRAMUSCULAR | Status: DC | PRN
  Administered 2021-05-07: 150 ug via INTRATHECAL

## 2021-05-07 MED ORDER — SOD CITRATE-CITRIC ACID 500-334 MG/5ML PO SOLN: 30.0000 mL | ORAL | Status: DC | PRN

## 2021-05-07 MED ORDER — HYDROMORPHONE HCL 1 MG/ML IJ SOLN: 0.2000 mg | INTRAMUSCULAR | Status: DC | PRN

## 2021-05-07 MED ORDER — MEPERIDINE HCL 25 MG/ML IJ SOLN: 6.2500 mg | INTRAMUSCULAR | Status: DC | PRN

## 2021-05-07 MED ORDER — BISACODYL 10 MG RE SUPP: 10.0000 mg | Freq: Every day | RECTAL | Status: DC | PRN

## 2021-05-07 MED ORDER — NALBUPHINE HCL 10 MG/ML IJ SOLN
5.0000 mg | Freq: Once | INTRAMUSCULAR | Status: DC | PRN
Start: 2021-05-07 — End: 2021-05-09

## 2021-05-07 MED ORDER — ONDANSETRON HCL 4 MG/2ML IJ SOLN
4.0000 mg | Freq: Four times a day (QID) | INTRAMUSCULAR | Status: DC | PRN
  Administered 2021-05-07: 4 mg via INTRAVENOUS

## 2021-05-07 MED ORDER — NIFEDIPINE ER OSMOTIC RELEASE 30 MG PO TB24
60.0000 mg | ORAL_TABLET | Freq: Every day | ORAL | Status: DC
  Administered 2021-05-07 – 2021-05-09 (×3): 60 mg via ORAL
  Filled 2021-05-07 (×3): qty 2

## 2021-05-07 MED ORDER — SIMETHICONE 80 MG PO CHEW: 80.0000 mg | CHEWABLE_TABLET | ORAL | Status: DC | PRN

## 2021-05-07 MED ORDER — EPHEDRINE SULFATE 50 MG/ML IJ SOLN
INTRAMUSCULAR | Status: DC | PRN
  Administered 2021-05-07: 10 mg via INTRAVENOUS

## 2021-05-07 MED ORDER — ONDANSETRON HCL 4 MG/2ML IJ SOLN: 4.0000 mg | Freq: Three times a day (TID) | INTRAMUSCULAR | Status: DC | PRN

## 2021-05-07 MED ORDER — KETOROLAC TROMETHAMINE 30 MG/ML IJ SOLN
30.0000 mg | Freq: Four times a day (QID) | INTRAMUSCULAR | Status: AC
  Administered 2021-05-07 – 2021-05-08 (×2): 30 mg via INTRAVENOUS
  Filled 2021-05-07 (×3): qty 1

## 2021-05-07 MED ORDER — ACETAMINOPHEN 500 MG PO TABS
1000.0000 mg | ORAL_TABLET | Freq: Four times a day (QID) | ORAL | Status: DC
  Administered 2021-05-07 – 2021-05-09 (×6): 1000 mg via ORAL
  Filled 2021-05-07 (×7): qty 2

## 2021-05-07 MED ORDER — FENTANYL CITRATE (PF) 100 MCG/2ML IJ SOLN: 50.0000 ug | INTRAMUSCULAR | Status: DC | PRN

## 2021-05-07 MED ORDER — SODIUM CHLORIDE 0.9 % IR SOLN
Status: DC | PRN
  Administered 2021-05-07: 1

## 2021-05-07 MED ORDER — ZOLPIDEM TARTRATE 5 MG PO TABS: 5.0000 mg | ORAL_TABLET | Freq: Every evening | ORAL | Status: DC | PRN

## 2021-05-07 MED ORDER — DIPHENHYDRAMINE HCL 25 MG PO CAPS: 25.0000 mg | ORAL_CAPSULE | ORAL | Status: DC | PRN

## 2021-05-07 MED ORDER — SIMETHICONE 80 MG PO CHEW
80.0000 mg | CHEWABLE_TABLET | Freq: Three times a day (TID) | ORAL | Status: DC
  Administered 2021-05-07 – 2021-05-09 (×5): 80 mg via ORAL
  Filled 2021-05-07 (×5): qty 1

## 2021-05-07 MED ORDER — OXYCODONE HCL 5 MG PO TABS
5.0000 mg | ORAL_TABLET | ORAL | Status: DC | PRN
  Administered 2021-05-09: 5 mg via ORAL
  Filled 2021-05-07: qty 1

## 2021-05-07 MED ORDER — SODIUM CHLORIDE 0.9 % IV SOLN
INTRAVENOUS | Status: AC
  Filled 2021-05-07: qty 500

## 2021-05-07 MED ORDER — PHENYLEPHRINE HCL-NACL 20-0.9 MG/250ML-% IV SOLN
INTRAVENOUS | Status: DC | PRN
  Administered 2021-05-07 (×2): 60 ug/min via INTRAVENOUS

## 2021-05-07 MED ORDER — FENTANYL CITRATE (PF) 100 MCG/2ML IJ SOLN: 25.0000 ug | INTRAMUSCULAR | Status: DC | PRN

## 2021-05-07 MED ORDER — MENTHOL 3 MG MT LOZG: 1.0000 | LOZENGE | OROMUCOSAL | Status: DC | PRN

## 2021-05-07 MED ORDER — SODIUM CHLORIDE 0.9 % IV SOLN
500.0000 mg | Freq: Once | INTRAVENOUS | Status: AC
  Administered 2021-05-07: 500 mg via INTRAVENOUS

## 2021-05-07 SURGICAL SUPPLY — 35 items
BENZOIN TINCTURE PRP APPL 2/3 (GAUZE/BANDAGES/DRESSINGS) ×2 IMPLANT
CHLORAPREP W/TINT 26ML (MISCELLANEOUS) ×2 IMPLANT
CLAMP CORD UMBIL (MISCELLANEOUS) IMPLANT
CLOTH BEACON ORANGE TIMEOUT ST (SAFETY) ×2 IMPLANT
DRESSING PREVENA PLUS CUSTOM (GAUZE/BANDAGES/DRESSINGS) ×1 IMPLANT
DRSG OPSITE POSTOP 4X10 (GAUZE/BANDAGES/DRESSINGS) ×2 IMPLANT
DRSG PREVENA PLUS CUSTOM (GAUZE/BANDAGES/DRESSINGS) ×2
ELECT REM PT RETURN 9FT ADLT (ELECTROSURGICAL) ×2
ELECTRODE REM PT RTRN 9FT ADLT (ELECTROSURGICAL) ×1 IMPLANT
EXTRACTOR VACUUM KIWI (MISCELLANEOUS) IMPLANT
GLOVE BIO SURGEON STRL SZ 6 (GLOVE) ×2 IMPLANT
GLOVE BIOGEL PI IND STRL 6 (GLOVE) ×1 IMPLANT
GLOVE BIOGEL PI IND STRL 7.0 (GLOVE) ×1 IMPLANT
GLOVE BIOGEL PI INDICATOR 6 (GLOVE) ×1
GLOVE BIOGEL PI INDICATOR 7.0 (GLOVE) ×1
GOWN STRL REUS W/TWL LRG LVL3 (GOWN DISPOSABLE) ×4 IMPLANT
KIT ABG SYR 3ML LUER SLIP (SYRINGE) IMPLANT
NEEDLE HYPO 25X5/8 SAFETYGLIDE (NEEDLE) IMPLANT
NS IRRIG 1000ML POUR BTL (IV SOLUTION) ×2 IMPLANT
PACK C SECTION WH (CUSTOM PROCEDURE TRAY) ×2 IMPLANT
PAD OB MATERNITY 4.3X12.25 (PERSONAL CARE ITEMS) ×2 IMPLANT
PENCIL SMOKE EVAC W/HOLSTER (ELECTROSURGICAL) ×2 IMPLANT
RTRCTR C-SECT PINK 25CM LRG (MISCELLANEOUS) ×2 IMPLANT
STRIP CLOSURE SKIN 1/2X4 (GAUZE/BANDAGES/DRESSINGS) ×2 IMPLANT
SUT MNCRL 0 VIOLET CTX 36 (SUTURE) ×1 IMPLANT
SUT MNCRL AB 4-0 PS2 18 (SUTURE) ×2 IMPLANT
SUT MONOCRYL 0 CTX 36 (SUTURE) ×1
SUT PLAIN 0 NONE (SUTURE) IMPLANT
SUT VIC AB 0 CTX 36 (SUTURE) ×2
SUT VIC AB 0 CTX36XBRD ANBCTRL (SUTURE) ×2 IMPLANT
SUT VIC AB 2-0 CT1 27 (SUTURE) ×2
SUT VIC AB 2-0 CT1 TAPERPNT 27 (SUTURE) ×2 IMPLANT
TOWEL OR 17X24 6PK STRL BLUE (TOWEL DISPOSABLE) ×2 IMPLANT
TRAY FOLEY W/BAG SLVR 14FR LF (SET/KITS/TRAYS/PACK) ×2 IMPLANT
WATER STERILE IRR 1000ML POUR (IV SOLUTION) ×2 IMPLANT

## 2021-05-07 NOTE — Progress Notes (Signed)
Bedside US: vertex presentation SVE 0/0/-3  Plan low dose pitocin, foley bulb when cervix is dilated.    Luther Redo, MD 05/07/21 4:30 AM

## 2021-05-07 NOTE — Progress Notes (Signed)
OBGYN note - FHR with late decels and deep variable decels, with repositioning resolved. FHR now 130, moderate variability and accels  Sve 0/long/high  Discussed with patient and her mom, given remote from delivery, TOLAC, recommend proceeding with CS at this time for fetal intolerance of labor  Patient agrees. OR notified.  Lyda Kalata, MD 05/07/2021 1:25pm

## 2021-05-07 NOTE — Op Note (Signed)
Procedure(s): CESAREAN SECTION Procedure Note  Charlene Mora female 25 y.o. 05/07/2021  PRE-OPERATIVE DIAGNOSIS:  Cesarean Section-Fetal Intolerance to Labor, pregnancy at [redacted]w[redacted]d history of cesarean section, chronic hypertension, fibroid  POST-OPERATIVE DIAGNOSIS:  same  Indication: Charlene Harpham89y.o. [id number] for planned TOLAC/IOL due to chronic hypertension, history of CS x1, on admission SVE closed/long/high, induced with pitocin. Patient had a prolonged decel to 90-100s, resolved with stopping pitocin and reposition. Pitocin was restarted and fetal testing was initially reassuring but then developed recurrent late decelerations followed by recurrent variable decelerations. The decelerations resolved with repositioning. SVE was still closed/long/high, due to remote from delivery, fetal intolerance of labor, CS was recommended and patient agreed to proceed.   PROCEDURE:  Procedure(s): CESAREAN SECTION (N/A)  SURGEON:  Surgeon(s) and Role:    * Taam-Akelman, RoLawrence SantiagoMD - Primary  ANESTHESIA:   spinal  EBL:  630cc   BLOOD ADMINISTERED:none  DRAINS: none   LOCAL MEDICATIONS USED:  NONE  SPECIMEN:  Source of Specimen:  placenta  DISPOSITION OF SPECIMEN:  L&D  COUNTS:  YES  PLAN OF CARE: Admit to inpatient   PATIENT DISPOSITION:  PACU - hemodynamically stable.   Delay start of Pharmacological VTE agent (>24hrs) due to surgical blood loss or risk of bleeding: not applicable  Findings: Normal mullerian anatomy.  Viable female infant "[name]" with weight 3240g (7pounds 2.3ounces) Apgars 8 and 9. Anterior fibroid 7x7cm. Filmy adhesions from bladder to uterus.   Complications:  None    Description of Procedure The patient was taken to the operating room where spinal anesthesia was found to be adequate.  The patient was placed in the dorsal supine position.  The patient was subsequently prepped and draped in the normal sterile fashion.    A low  transverse skin incision was made with a scalpel and carried down to the level of the fascia with the Bovie.  The fascia was incised in the midline with the scalpel and extended laterally with curved Mayo scissors.   The Kocher clamps were placed on the inferior fascial edge and the underlying rectus muscle was dissected off with curved Mayo's scissors.  Kocher clamps were applied to the superior fascial edge and the fascia was dissected off the rectus muscle sharply using the scalpel. The rectus muscles then were separated in the midline.  The peritoneum was found free of adherent bowel and the peritoneal cavity was entered bluntly.  The uterus was identified and the alexis retractor was placed intraperitoneal.  Filmy adhesions noted from bladder to uterus. A bladder flap was then created sharply with Metzenbaum scissors and separated from the lower uterine segment digitally.   A low transverse hysterotomy was then made with a scalpel.  The infant was found in the cephalic presentation was delivered atraumatically and without difficulty in the usual fashion.  After 60 seconds of delayed cord clamping the cord was clamped and cut and the infant was handed off to the pediatricians.  The placenta was delivered with gentle traction on umbilical cord and manual massage of the uterine fundus.  The uterus was cleared of all clot and debris.  The hysterotomy was then closed with 0 Monocryl in a running locked fashion.  Followed by 0 Monocryl in an imbricating fashion.  The hysterotomy was found to be hemostatic. Anterior fibroid noted, 7x7cm. The peritoneum was closed with 2-0 Vicryl in a running fashion.  The fascia was closed with a 0 Vicryl suture in a continuous running fashion from  each side and tied in the midline.  The subcutaneous tissue was irrigated and rendered hemostatic with cautery.  The subcutaneous layer was subsequently closed with 2-0 Vicryl in a continuous running fashion.  The skin was closed with  4-0 Monocryl in a running subcuticular fashion.  Sponge, lap and needle counts were correct. Prevena dressing was placed on the incision.   Charlene Mora K Taam-Akelman 05/07/21 3:42 PM

## 2021-05-07 NOTE — Anesthesia Procedure Notes (Signed)
Spinal  Patient location during procedure: OR Start time: 05/07/2021 2:06 PM End time: 05/07/2021 2:10 PM Reason for block: surgical anesthesia Staffing Performed: anesthesiologist  Anesthesiologist: Albertha Ghee, MD Preanesthetic Checklist Completed: patient identified, IV checked, risks and benefits discussed, surgical consent, monitors and equipment checked, pre-op evaluation and timeout performed Spinal Block Patient position: sitting Prep: DuraPrep Patient monitoring: cardiac monitor, continuous pulse ox and blood pressure Approach: midline Location: L3-4 Injection technique: single-shot Needle Needle type: Pencan  Needle gauge: 24 G Needle length: 9 cm Assessment Sensory level: T10 Events: CSF return Additional Notes Functioning IV was confirmed and monitors were applied. Sterile prep and drape, including hand hygiene and sterile gloves were used. The patient was positioned and the spine was prepped. The skin was anesthetized with lidocaine.  Free flow of clear CSF was obtained prior to injecting local anesthetic into the CSF.  The spinal needle aspirated freely following injection.  The needle was carefully withdrawn.  The patient tolerated the procedure well.

## 2021-05-07 NOTE — H&P (Signed)
Charlene Mora is a 25 y.o. female [phone] [id number] [facility] She reports no LOF, VB, Contractions. Normal FM. Denies HA, VC, RUQ pain.   Pregnancy c/b: CHTN: on procardia '60mg'$  QD History of HSV: by serology, no history of lesions. 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 Social History:  reports that she has quit smoking. Her smoking use included cigarettes. She has never used smokeless tobacco. She reports that she does not currently use alcohol. She reports that she does not currently use drugs after having used the following drugs: Marijuana.     Maternal Diabetes: No Genetic Screening: Normal - low risk NIPT Maternal Ultrasounds/Referrals: Normal Fetal Ultrasounds or other [facility]errals:  [facility] Fundal placenta Maternal Substance Abuse:  No Significant Maternal Medications:  Meds include: Other: ASA, procardia Significant Maternal Lab Results:  Group B Strep negative Other Comments:   COVID+ on preop labs  Review of Systems Per HPI Exam Physical Exam  Dilation: Closed Effacement (%): Thick Station: -3 Exam by:: dr mBrien MatesBlood pressure 133/63, pulse 78, temperature 98.2 F  (36.8 C), temperature source Oral, resp. rate 16, height '5\' 7"'$  (1.702 m), weight (!) 138.7 kg, last menstrual period 08/10/2020. NAD, resting comfortably, Gravid abdomen Card RRR Pulm CTAB Lower extremity trace edema, no evidence of DVT Fetal testing: FHR 150, Cat 1. Toco quiet Prenatal labs: ABO, Rh:  --/--/B POS (08/11 0436) Antibody: NEG (08/11 0436) Rubella: Immune (02/15 0000) RPR: Nonreactive (02/15 0000)  HBsAg: Negative (02/15 0000)  HIV: Non-reactive (02/15 0000)  GBS: Negative/-- (07/29 0000)   Chemistry Recent Labs  Lab 05/07/21 0436  NA 134*  K 3.9  CL 105  CO2 22  GLUCOSE 84  BUN 9  CREATININE 0.53  CALCIUM 9.1  GFRNONAA >60  ANIONGAP 7    Recent Labs  Lab 05/07/21 0436  PROT 6.5  ALBUMIN 2.7*  AST 16  ALT 10  ALKPHOS 73  BILITOT 0.5   Hematology Recent Labs  Lab 05/07/21 0436  WBC 13.1*  RBC 4.20  HGB 10.6*  HCT 34.4*  MCV 81.9  MCH 25.2*  MCHC 30.8  RDW 15.3  PLT 217     Assessment/Plan: AKrystalann Mora a 25y.o. female [id number] [facility] scheduled [facility] SVE closed, on pitocin, plan to place FB prn TOLAC: h/o CS for twins. Patient counseled on TOLAC vs. rCS and patient  desires TOLAC. Given prolonged decel earlier this AM, counseled patient on risk of emergency CS, patient signed consent.   CHTN: on procardia '60mg'$  QD. Last EFW 04/21/2021 78%, AC 89% COVID+ on preop labs, asymptomatic  Variable presentation: confirmed cephalic on admission Obesity: current BMI 47.9  Charlene Mora 05/07/2021, 8:24 AM

## 2021-05-07 NOTE — Plan of Care (Signed)
Patient admitted to [facility] under Airborne precautions; MGM at bedside.  Pt oriented to room, safety precautions reviewed and discussed; Will continue to monitor.

## 2021-05-07 NOTE — Anesthesia Preprocedure Evaluation (Signed)
Anesthesia Evaluation  Patient identified by MRN, date of birth, ID band Patient awake    Reviewed: Allergy & Precautions, H&P , NPO status , Patient's Chart, lab work & pertinent test results  Airway Mallampati: II   Neck ROM: full    Dental   Pulmonary former smoker,  COVID+   breath sounds clear to auscultation       Cardiovascular hypertension,  Rhythm:regular Rate:Normal     Neuro/Psych    GI/Hepatic   Endo/Other  Morbid obesity  Renal/GU      Musculoskeletal   Abdominal   Peds  Hematology   Anesthesia Other Findings   Reproductive/Obstetrics (+) Pregnancy                             Anesthesia Physical Anesthesia Plan  ASA: 2  Anesthesia Plan: Spinal   Post-op Pain Management:    Induction: Intravenous  PONV Risk Score and Plan: 2 and Ondansetron and Treatment may vary due to age or medical condition  Airway Management Planned: Nasal Cannula  Additional Equipment:   Intra-op Plan:   Post-operative Plan:   Informed Consent: I have reviewed the patients History and Physical, chart, labs and discussed the procedure including the risks, benefits and alternatives for the proposed anesthesia with the patient or authorized representative who has indicated his/her understanding and acceptance.     Dental advisory given  Plan Discussed with: CRNA, Anesthesiologist and Surgeon  Anesthesia Plan Comments:         Anesthesia Quick Evaluation

## 2021-05-07 NOTE — Transfer of Care (Signed)
Immediate Anesthesia Transfer of Care Note  Patient: Charlene Mora  Procedure(s) Performed: CESAREAN SECTION  Patient Location: PACU  Anesthesia Type:Spinal  Level of Consciousness: awake, alert  and oriented  Airway & Oxygen Therapy: Patient Spontanous Breathing  Post-op Assessment: Report given to RN and Post -op Vital signs reviewed and stable  Post vital signs: Reviewed and stable  Last Vitals:  Vitals Value Taken Time  BP 177/87 05/07/21 1930  Temp 36.7 C 05/07/21 1930  Pulse 74 05/07/21 1930  Resp 18 05/07/21 1930  SpO2 99 % 05/07/21 1930    Last Pain:  Vitals:   05/07/21 1930  TempSrc: Oral  PainSc:          Complications: No notable events documented.

## 2021-05-08 LAB — CBC
HCT: 30.7 % — ABNORMAL LOW (ref 36.0–46.0)
Hemoglobin: 9.7 g/dL — ABNORMAL LOW (ref 12.0–15.0)
MCH: 25.6 pg — ABNORMAL LOW (ref 26.0–34.0)
MCHC: 31.6 g/dL (ref 30.0–36.0)
MCV: 81 fL (ref 80.0–100.0)
Platelets: 229 10*3/uL (ref 150–400)
RBC: 3.79 MIL/uL — ABNORMAL LOW (ref 3.87–5.11)
RDW: 14.7 % (ref 11.5–15.5)
WBC: 23.1 10*3/uL — ABNORMAL HIGH (ref 4.0–10.5)
nRBC: 0 % (ref 0.0–0.2)

## 2021-05-08 MED ORDER — FERROUS SULFATE 325 (65 FE) MG PO TABS
325.0000 mg | ORAL_TABLET | Freq: Every day | ORAL | Status: DC
  Administered 2021-05-08 – 2021-05-09 (×2): 325 mg via ORAL
  Filled 2021-05-08 (×2): qty 1

## 2021-05-08 NOTE — Lactation Note (Signed)
This note was copied from a baby's chart. Lactation Consultation Note  Patient Name: Charlene Mora [id number] Date: 05/08/2021 Reason for consult: Initial assessment;Early term 37-38.6wks Age:27 hours  Baby in blankets cueing.  [place] assisted with hand expression and placed infant STS.  Infant would not suck gloved finger but clamped down.  When infant opened, his tongue raised slightly.  Lingual frenulum noted toward front of tongue.  Infant latched with mom using asymmetric breast hold and cross cradle position.  Continuous sucks noted with wide gape and without audible swallows until compressions used.  Infant sustained latched for 20 minutes then was switched to the next breast.  Nipple was crimped when infant came off the left side.  Mom was able to latch infant with minimal assistance to the right side.   BF basics reviewed.  Brochure provided [employer] OP services, phone line and BFSG info shared.  All questions answered.  LC communicated to RN about mom's request for pump set up for extra stimulation.   Maternal Data Has patient been taught Hand Expression?: Yes Does the patient have breastfeeding experience prior to this delivery?: Yes How long did the patient breastfeed?: 27 year old twin/ pumped for 2 months,  Feeding Mother's Current Feeding Choice: Breast Milk  LATCH Score Latch: Grasps breast easily, tongue down, lips flanged, rhythmical sucking.  Audible Swallowing: A few with stimulation (a couple heard with massage and compression only)  Type of Nipple: Everted at rest and after stimulation  Comfort (Breast/Nipple): Soft / non-tender  Hold (Positioning): Assistance needed to correctly position infant at breast and maintain latch.  LATCH Score: 8   Lactation Tools Discussed/Used Tools: Pump (RN will set up DEBP today for added stimulation to milk supply) Breast pump type: Double-Electric Breast Pump Reason for Pumping: mom's request.  mom is currently using her wearable  pump for stimulation.  LC suggested DEBP Pumping frequency:  (encouraged mom to pump after feedings when she desires)  Interventions Interventions: Breast feeding basics reviewed;Skin to skin;Assisted with latch;Breast massage;Support pillows;Adjust position;Breast compression;Hand express  Discharge Pump: Personal (has two personal pumps) WIC Program: Yes  Consult Status Consult Status: Follow-up Date: 05/09/21 Follow-up type: In-patient    Ferne Coe [facility] 05/08/2021, 8:36 AM

## 2021-05-08 NOTE — Lactation Note (Signed)
This note was copied from a baby's chart. Lactation Consultation Note  Patient Name: Charlene Mora [id number] Date: 05/08/2021   Age:27 hours Per RN Sharyn Lull),  infant is breastfeeding well recently breastfeed for 20 minutes and can be seen by [facility] later in morning.   LC informed other [place] coming on shift at 0300 am to follow up with mom later today.  Maternal Data    Feeding    LATCH Score Latch: Grasps breast easily, tongue down, lips flanged, rhythmical sucking.  Audible Swallowing: A few with stimulation  Type of Nipple: Everted at rest and after stimulation  Comfort (Breast/Nipple): Soft / non-tender  Hold (Positioning): No assistance needed to correctly position infant at breast.  LATCH Score: 9   Lactation Tools Discussed/Used    Interventions    Discharge    Consult Status      Vicente Serene 05/08/2021, 3:28 AM

## 2021-05-08 NOTE — Progress Notes (Addendum)
  Patient is eating, ambulating, voiding.  Pain control is good.  Vitals:   05/07/21 2040 05/07/21 2130 05/07/21 2335 05/08/21 0200  BP: (!) 158/87     Pulse: 69     Resp: '18 17 17 17  '$ Temp: 97.8 F (36.6 C)   98.6 F (37 C)  TempSrc: Oral   Oral  SpO2: 99% 100% 98% 98%  Weight:      Height:        lungs:   clear to auscultation cor:    RRR Abdomen:  soft, appropriate tenderness, incisions intact and without erythema or exudate ex:    no cords   Lab Results  Component Value Date   WBC 23.1 (H) 05/08/2021   HGB 9.7 (L) 05/08/2021   HCT 30.7 (L) 05/08/2021   MCV 81.0 05/08/2021   PLT 229 05/08/2021    --/--/B POS (08/11 0436)/RI  A/P    Post operative day 1.  Routine post op and postpartum care.  Expect d/c tomorrow.   Procardia restarted today.   Percocet for pain control.  Iron for mild anemia.

## 2021-05-09 MED ORDER — OXYCODONE-ACETAMINOPHEN 5-325 MG PO TABS: 1.0000 | ORAL_TABLET | ORAL | 0 refills | Status: DC | PRN

## 2021-05-09 NOTE — Progress Notes (Signed)
  Patient is eating, ambulating, voiding.  Pain control is good.  Vitals:   05/08/21 0200 05/08/21 1100 05/08/21 2043 05/09/21 0626  BP:  132/75 (!) 144/69 132/73  Pulse:  81 80 78  Resp: '17 18 18 18  '$ Temp: 98.6 F (37 C) 98.6 F (37 C) 98.4 F (36.9 C) 98.3 F (36.8 C)  TempSrc: Oral Oral Oral Oral  SpO2: 98%  100% 100%  Weight:      Height:        lungs:   clear to auscultation cor:    RRR Abdomen:  soft, appropriate tenderness, incisions intact and without erythema or exudate ex:    no cords   Lab Results  Component Value Date   WBC 23.1 (H) 05/08/2021   HGB 9.7 (L) 05/08/2021   HCT 30.7 (L) 05/08/2021   MCV 81.0 05/08/2021   PLT 229 05/08/2021    --/--/B POS (08/11 0436)/RI  A/P    Post operative day 2.  Routine post op and postpartum care.  Expect d/c today.  Percocet for pain control.  Iron.

## 2021-05-09 NOTE — Discharge Summary (Signed)
Postpartum Discharge Summary  Date of Service updated      Patient Name: Charlene Mora DOB: 19-Feb-1996 MRN: [id number]  Date of admission: 05/07/2021 Delivery date:05/07/2021  Delivering provider: Lyda Kalata K  Date of discharge: 05/09/2021  Admitting diagnosis: Chronic hypertension during pregnancy [O10.919] Intrauterine pregnancy: [redacted]w[redacted]d    Secondary diagnosis:  Active Problems:   Chronic hypertension during pregnancy  Additional problems: CHTN , covid +   Discharge diagnosis: Term Pregnancy Delivered and CHTN                                              Post partum procedures: none Augmentation: AROM, Pitocin, and Cytotec Complications: None  Hospital course: Induction of Labor With Cesarean Section   25y.o. yo [id number] admitted to the hospital 05/07/2021 for induction of labor. Patient had a labor course significant for nonreassuring FHTs. The patient went for cesarean section due to Non-Reassuring FHR. Delivery details are as follows: Membrane Rupture Time/Date: 2:42 PM ,05/07/2021   Delivery Method:C-Section, Low Transverse  Details of operation can be found in separate operative Note.  Patient had an uncomplicated postpartum course. She is ambulating, tolerating a regular diet, passing flatus, and urinating well.  Patient is discharged home in stable condition on 05/09/21.      Newborn Data: Birth date:05/07/2021  Birth time:2:42 PM  Gender:Female  Living status:Living  Apgars:8 ,9  Weight:3240 g                                Magnesium Sulfate received: No BMZ received: No Rhophylac:No MMR:No T-DaP:Given prenatally Flu: No Transfusion:No  Physical exam  Vitals:   05/08/21 0200 05/08/21 1100 05/08/21 2043 05/09/21 0626  BP:  132/75 (!) 144/69 132/73  Pulse:  81 80 78  Resp: 17 18 18 18   Temp: 98.6 F (37 C) 98.6 F (37 C) 98.4 F (36.9 C) 98.3 F (36.8 C)  TempSrc: Oral Oral Oral Oral  SpO2: 98%  100% 100%  Weight:       Height:       Labs: Lab Results  Component Value Date   WBC 23.1 (H) 05/08/2021   HGB 9.7 (L) 05/08/2021   HCT 30.7 (L) 05/08/2021   MCV 81.0 05/08/2021   PLT 229 05/08/2021   CMP Latest Ref Rng & Units 05/07/2021  Glucose 70 - 99 mg/dL 84  BUN 6 - 20 mg/dL 9  Creatinine 0.44 - 1.00 mg/dL 0.53  Sodium 135 - 145 mmol/L 134(L)  Potassium 3.5 - 5.1 mmol/L 3.9  Chloride 98 - 111 mmol/L 105  CO2 22 - 32 mmol/L 22  Calcium 8.9 - 10.3 mg/dL 9.1  Total Protein 6.5 - 8.1 g/dL 6.5  Total Bilirubin 0.3 - 1.2 mg/dL 0.5  Alkaline Phos 38 - 126 U/L 73  AST 15 - 41 U/L 16  ALT 0 - 44 U/L 10   Edinburgh Score: Edinburgh Postnatal Depression Scale Screening Tool 05/08/2021  I have been able to laugh and see the funny side of things. (No Data)      After visit meds: take Iron over the counter Allergies as of 05/09/2021   No Known Allergies      Medication List     STOP taking these medications    acetaminophen 650 MG CR  tablet Commonly known as: Tylenol 8 Hour   aspirin EC 81 MG tablet       TAKE these medications    famotidine 20 MG tablet Commonly known as: PEPCID Take 20 mg by mouth 2 (two) times daily.   NIFEdipine 60 MG 24 hr tablet Commonly known as: PROCARDIA XL/NIFEDICAL XL Take 60 mg by mouth daily.   oxyCODONE-acetaminophen 5-325 MG tablet Commonly known as: PERCOCET/ROXICET Take 1 tablet by mouth every 4 (four) hours as needed for severe pain.   Prenatal Vitamins 28-0.8 MG Tabs Take 1 tablet by mouth daily.               Discharge Care Instructions  (From admission, onward)           Start     Ordered   05/09/21 0000  Discharge wound care:       Comments: Sitz baths and icepacks to perineum.  If stitches, they will dissolve.   05/09/21 0923             Discharge home in stable condition Infant Feeding:  ? Infant Disposition:home with mother Discharge instruction: per After Visit Summary and Postpartum booklet. Activity:  Advance as tolerated. Pelvic rest for 6 weeks.  Diet: routine diet Anticipated Birth Control: Unsure Postpartum Appointment:4 weeks Additional Postpartum F/U: BP check 2 weeks Future Appointments:No future appointments. Follow up Visit:      05/09/2021 Daria Pastures, MD

## 2021-05-17 ENCOUNTER — Inpatient Hospital Stay (HOSPITAL_COMMUNITY): Admit: 2021-05-17 | Payer: Self-pay

## 2021-05-20 NOTE — Anesthesia Postprocedure Evaluation (Signed)
Anesthesia Post Note  Patient: Henri Medal  Procedure(s) Performed: CESAREAN SECTION     Patient location during evaluation: PACU Anesthesia Type: Spinal Level of consciousness: oriented and awake and alert Pain management: pain level controlled Vital Signs Assessment: post-procedure vital signs reviewed and stable Respiratory status: spontaneous breathing, respiratory function stable and patient connected to nasal cannula oxygen Cardiovascular status: blood pressure returned to baseline and stable Postop Assessment: no headache, no backache and no apparent nausea or vomiting Anesthetic complications: no   No notable events documented.  Last Vitals:  Vitals:   05/09/21 0626 05/09/21 1019  BP: 132/73 (!) 148/72  Pulse: 78 92  Resp: 18   Temp: 36.8 C   SpO2: 100%     Last Pain:  Vitals:   05/09/21 1222  TempSrc:   PainSc: 7                  Zachariah Pavek S

## 2021-05-21 ENCOUNTER — Telehealth (HOSPITAL_COMMUNITY): Payer: Self-pay | Admitting: *Deleted

## 2021-05-21 NOTE — Telephone Encounter (Signed)
Attempted hospital discharge follow-up call. No answer received. Erline Levine, RN, 05/21/21, [phone].

## 2022-02-03 ENCOUNTER — Ambulatory Visit
Admission: RE | Admit: 2022-02-03 | Discharge: 2022-02-03 | Disposition: A | Discharge status: Home / Self Care | Payer: Medicaid Other | Source: Ambulatory Visit | Attending: Emergency Medicine | Admitting: Emergency Medicine

## 2022-02-03 VITALS — BP 138/72 | HR 77 | Temp 98.0°F | Resp 18

## 2022-02-03 DIAGNOSIS — Z20818 Contact with and (suspected) exposure to other bacterial communicable diseases: Secondary | ICD-10-CM | POA: Diagnosis not present

## 2022-02-03 DIAGNOSIS — H60391 Other infective otitis externa, right ear: Secondary | ICD-10-CM | POA: Insufficient documentation

## 2022-02-03 LAB — POCT RAPID STREP A (OFFICE): Rapid Strep A Screen: NEGATIVE

## 2022-02-03 MED ORDER — CIPROFLOXACIN-DEXAMETHASONE 0.3-0.1 % OT SUSP: 4.0000 [drp] | Freq: Two times a day (BID) | OTIC | 0 refills | Status: DC

## 2022-02-03 NOTE — ED Triage Notes (Signed)
Pt c/o right ear pain that radiates down her neck. ?Started: yesterday ?

## 2022-02-03 NOTE — ED Provider Notes (Signed)
?[facility] ? ? ? ?CSN: [id number] ?Arrival date & time: 02/03/22  9678 ?  ? ?HISTORY  ? ?Chief Complaint  ?Patient presents with  ? Ear Fullness  ?  Ear is painful to touch and feels clogged. - Entered by patient  ? ?HPI ?Charlene Mora is a 26 y.o. female. Patient presents to urgent care today complaining of acute onset of right ear pain that radiates down the right side of her neck that began yesterday.  Patient states she has not had pain like this in her ear before.  Patient denies hearing loss.  Patient states her ear has a feeling of fullness at this time.  Patient states she has pain in her right ear when she is, swallows, coughs.  Patient states she is unable to lie on her right side at this time secondary to the pain in her right ear and the right side of her neck.  Patient also complains of having a sore throat, states her daughter was diagnosed with strep throat yesterday but has been sick for several days.  Patient states her throat began to feel sore this morning. ? ?The history is provided by the patient.  ?Past Medical History:  ?Diagnosis Date  ? 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 Hypertension   ? STD (sexually transmitted disease)   ? Chlamydia  ? ?Patient Active Problem List  ? Diagnosis Date Noted  ? Chronic hypertension during pregnancy 05/07/2021  ? ?Past Surgical History:  ?Procedure Laterality Date  ? CESAREAN SECTION    ? CESAREAN SECTION N/A 05/07/2021  ? Procedure: CESAREAN SECTION;  Surgeon: Jonelle Sidle, MD;  Location: [facility];  Service: Obstetrics;  Laterality: N/A;  ? TONSILLECTOMY    ? WISDOM TOOTH EXTRACTION    ? ?OB History   ? ? Gravida  ?4  ? Para  ?2  ? Term  ?2  ? Preterm  ?0  ? AB  ?2  ? Living  ?3  ?  ? ? SAB  ?0  ? IAB  ?1  ? Ectopic  ?1  ? Multiple  ?1  ? Live Births  ?1  ?   ?  ?  ? ?Home Medications   ? ?Prior to Admission medications   ?Medication Sig Start Date End Date Taking? Authorizing Provider   ?ciprofloxacin-dexamethasone (CIPRODEX) OTIC suspension Place 4 drops into the right ear 2 (two) times daily. X 7 days 02/03/22  Yes Lynden Oxford Scales, PA-C  ? ?Family History ?Family History  ?Problem Relation Age of Onset  ? Anemia Mother   ? Breast cancer Maternal Aunt   ? Hypertension Maternal Grandmother   ? Heart Problems Maternal Grandmother   ? Diabetes Maternal Grandfather   ? ?Social History ?Social History  ? ?Tobacco Use  ? Smoking status: Former  ?  Types: Cigarettes  ? Smokeless tobacco: Never  ? Tobacco comments:  ?  marijuana  ?Vaping Use  ? Vaping Use: Never used  ?Substance Use Topics  ? Alcohol use: Not Currently  ?  Comment: occasional  ? Drug use: Not Currently  ?  Types: Marijuana  ? ?Allergies   ?Patient has no known allergies. ? ?Review of Systems ?Review of Systems ?Pertinent findings noted in history of present illness.  ? ?Physical Exam ?Triage Vital Signs ?ED Triage Vitals  ?Enc Vitals Group  ?   BP 07/24/21 0827 (!) 147/82  ?   Pulse Rate 07/24/21  0827 72  ?   Resp 07/24/21 0827 18  ?   Temp 07/24/21 0827 98.3 ?F (36.8 ?C)  ?   Temp Source 07/24/21 0827 Oral  ?   SpO2 07/24/21 0827 98 %  ?   Weight --   ?   Height --   ?   Head Circumference --   ?   Peak Flow --   ?   Pain Score 07/24/21 0826 5  ?   Pain Loc --   ?   Pain Edu? --   ?   Excl. in [place]? --   ?No data found. ? ?Updated Vital Signs ?BP 138/72 (BP Location: Right Arm)   Pulse 77   Temp 98 ?F (36.7 ?C) (Oral)   Resp 18   LMP 01/19/2022   SpO2 97%   Breastfeeding No  ? ?Physical Exam ?Vitals and nursing note reviewed.  ?Constitutional:   ?   General: She is not in acute distress. ?   Appearance: Normal appearance. She is not ill-appearing.  ?HENT:  ?   Head: Normocephalic and atraumatic.  ?   Salivary Glands: Right salivary gland is not diffusely enlarged or tender. Left salivary gland is not diffusely enlarged or tender.  ?   Right Ear: Hearing, tympanic membrane and external ear normal. Tenderness present. No  drainage or swelling. No middle ear effusion. There is no impacted cerumen. Tympanic membrane is not erythematous or bulging.  ?   Left Ear: Hearing, tympanic membrane, ear canal and external ear normal. No drainage.  No middle ear effusion. There is no impacted cerumen. Tympanic membrane is not erythematous or bulging.  ?   Ears:  ?   Comments: Right ear canal diffusely edematous, erythematous with purulent material present, right TM only partially visualized but appears normal. ?   Nose: Nose normal. No nasal deformity, septal deviation, mucosal edema, congestion or rhinorrhea.  ?   Right Turbinates: Not enlarged, swollen or pale.  ?   Left Turbinates: Not enlarged, swollen or pale.  ?   Right Sinus: No maxillary sinus tenderness or frontal sinus tenderness.  ?   Left Sinus: No maxillary sinus tenderness or frontal sinus tenderness.  ?   Mouth/Throat:  ?   Lips: Pink. No lesions.  ?   Mouth: Mucous membranes are moist. Mucous membranes are pale. No oral lesions.  ?   Pharynx: Oropharynx is clear. Uvula midline. No pharyngeal swelling, oropharyngeal exudate, posterior oropharyngeal erythema or uvula swelling.  ?   Tonsils: No tonsillar exudate. 0 on the right. 0 on the left.  ?Eyes:  ?   General: Lids are normal.     ?   Right eye: No discharge.     ?   Left eye: No discharge.  ?   Extraocular Movements: Extraocular movements intact.  ?   Conjunctiva/sclera: Conjunctivae normal.  ?   Right eye: Right conjunctiva is not injected.  ?   Left eye: Left conjunctiva is not injected.  ?   Pupils: Pupils are equal, round, and reactive to light.  ?Neck:  ?   Trachea: Trachea and phonation normal.  ?Cardiovascular:  ?   Rate and Rhythm: Normal rate and regular rhythm.  ?   Pulses: Normal pulses.  ?   Heart sounds: Normal heart sounds, S1 normal and S2 normal. No murmur heard. ?  No friction rub. No gallop.  ?Pulmonary:  ?   Effort: Pulmonary effort is normal. No tachypnea, bradypnea, accessory muscle usage, prolonged  expiration, respiratory distress or retractions.  ?   Breath sounds: Normal breath sounds. No stridor, decreased air movement or transmitted upper airway sounds. No decreased breath sounds, wheezing, rhonchi or rales.  ?Chest:  ?   Chest wall: No tenderness.  ?Musculoskeletal:     ?   General: Normal range of motion.  ?   Cervical back: Normal range of motion and neck supple. Normal range of motion.  ?Lymphadenopathy:  ?   Cervical: Cervical adenopathy present.  ?   Right cervical: Posterior cervical adenopathy present.  ?   Left cervical: Posterior cervical adenopathy present.  ?Skin: ?   General: Skin is warm and dry.  ?   Findings: No erythema or rash.  ?Neurological:  ?   General: No focal deficit present.  ?   Mental Status: She is alert and oriented to person, place, and time.  ?Psychiatric:     ?   Mood and Affect: Mood normal.     ?   Behavior: Behavior normal.  ? ? ?Visual Acuity ?Right Eye Distance:   ?Left Eye Distance:   ?Bilateral Distance:   ? ?Right Eye Near:   ?Left Eye Near:    ?Bilateral Near:    ? ?UC Couse / Diagnostics / Procedures:  ?  ?EKG ? ?Radiology ?No results found. ? ?Procedures ?Procedures (including critical care time) ? ?UC Diagnoses / Final Clinical Impressions(s)   ?I have reviewed the triage vital signs and the nursing notes. ? ?Pertinent labs & imaging results that were available during my care of the patient were reviewed by me and considered in my medical decision making (see chart for details).   ?Final diagnoses:  ?Exposure to Streptococcal pharyngitis  ?Acute infective otitis externa, right  ? ?Rapid strep test is negative, throat culture will be performed.  Patient will be provided with antibiotics if needed.  Patient provided with Ciprodex eardrops for acute otitis externa.  Patient advised to return if symptoms do not improve in the next 5 to 7 days. ? ?ED Prescriptions   ? ? Medication Sig Dispense Auth. Provider  ? ciprofloxacin-dexamethasone (CIPRODEX) OTIC suspension  Place 4 drops into the right ear 2 (two) times daily. X 7 days 7.5 mL Lynden Oxford Scales, PA-C  ? ?  ? ?PDMP not reviewed this encounter. ? ?Pending results:  ?Labs Reviewed  ?CULTURE, GROUP A STREP [facility])  ?POCT RAPID STREP A

## 2022-02-03 NOTE — Discharge Instructions (Addendum)
For the external infection of your right ear, please begin Ciprodex eardrops twice daily for the next 7 days.  For the first several doses, you may find it beneficial to lie down on your left side and allow the eardrops to remain in your ear for 5 to 10 minutes before you hold your head upright again. ? ?Your strep test today is negative.  Streptococcal throat culture will be performed per our protocol.  The result of your throat culture will be posted to your MyChart once it is complete, this typically takes 3 to 5 days.   ?  ?If your streptococcal throat culture is positive, you will be contacted by phone and antibiotics will be prescribed for you.   ?  ?Thank you for visiting urgent care today.  Please let us know there is anything else we can do to help. ? ?

## 2022-02-06 LAB — CULTURE, GROUP A STREP (THRC)

## 2023-02-08 IMAGING — US US MFM FETAL BPP W/O NON-STRESS
1 series · 15 of 28 positions shown · non-contrast
Comparison: none

[Series 1: us mfm fetal bpp w/o non-stress · 37 acquisitions, 15 frames shown]
[im 1/37]
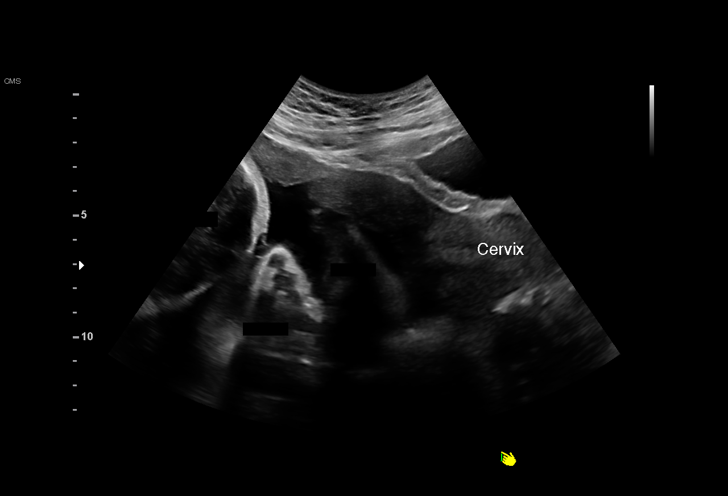
[im 3/37]
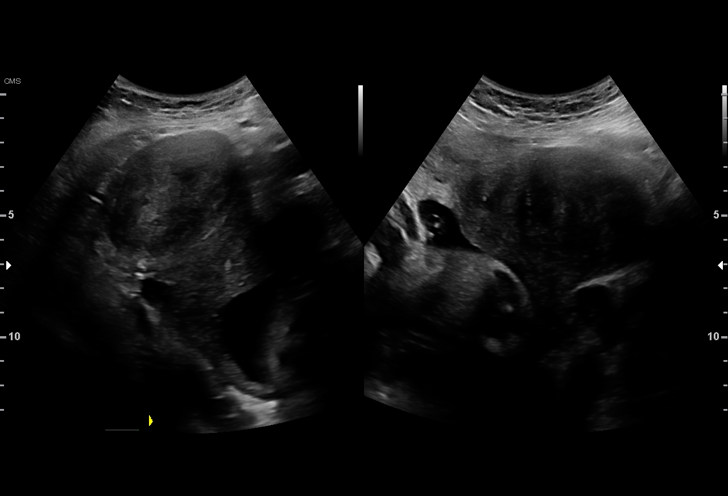
[im 6/37]
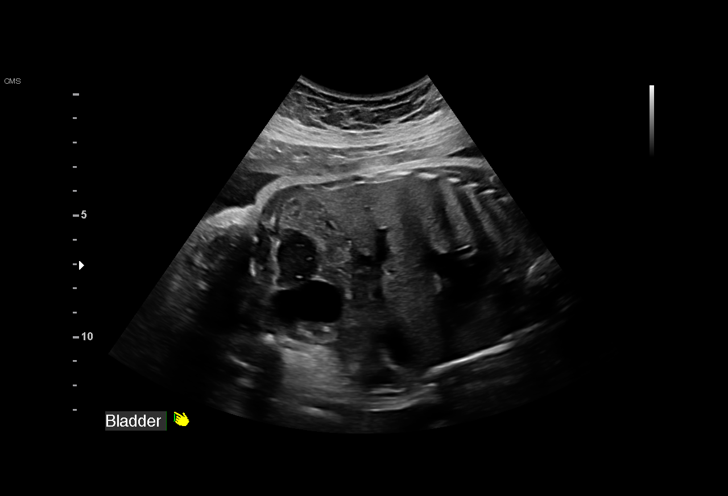
[im 9/37]
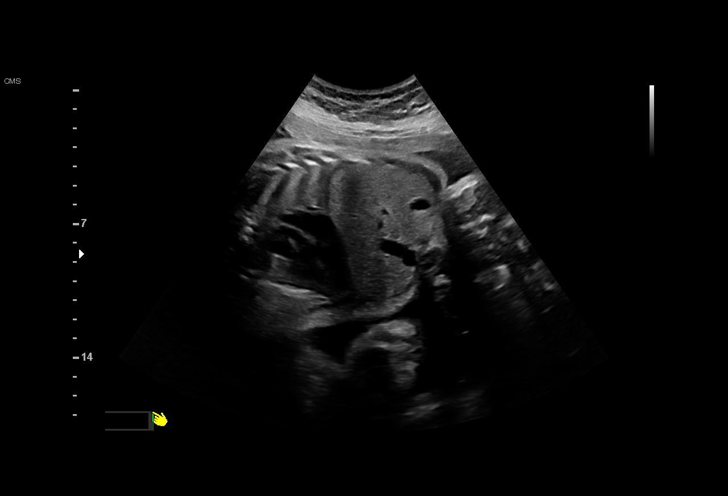
[im 11/37]
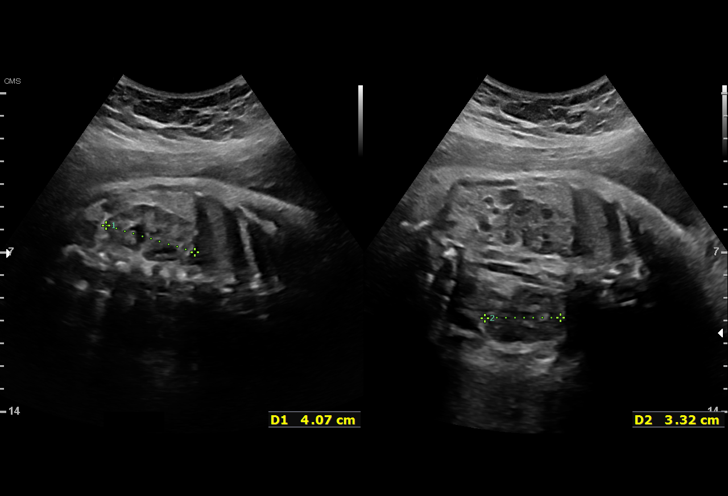
[im 14/37]
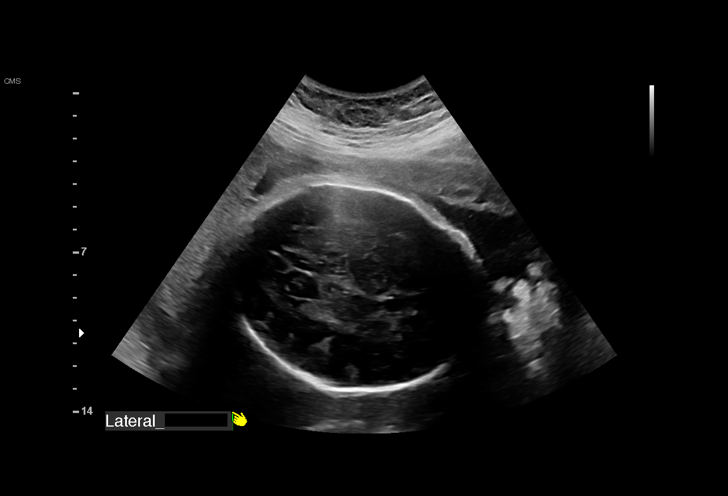
[im 17/37]
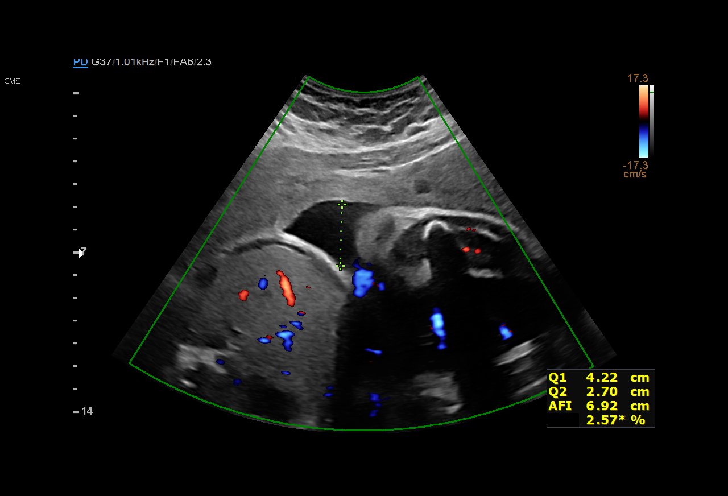
[im 19/37]
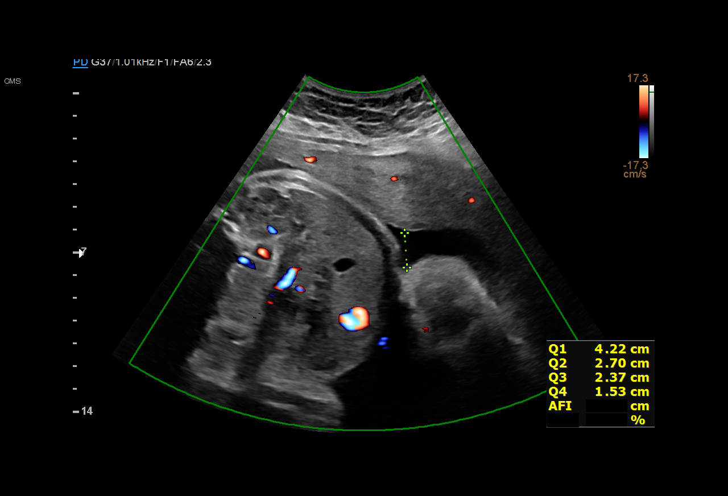
[im 21/37]
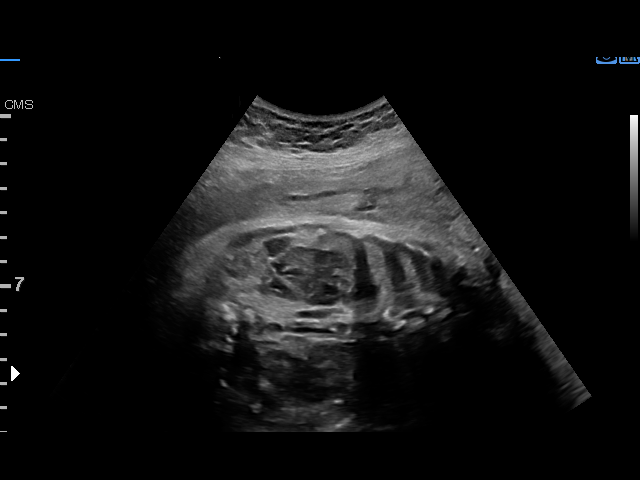
[im 23/37]
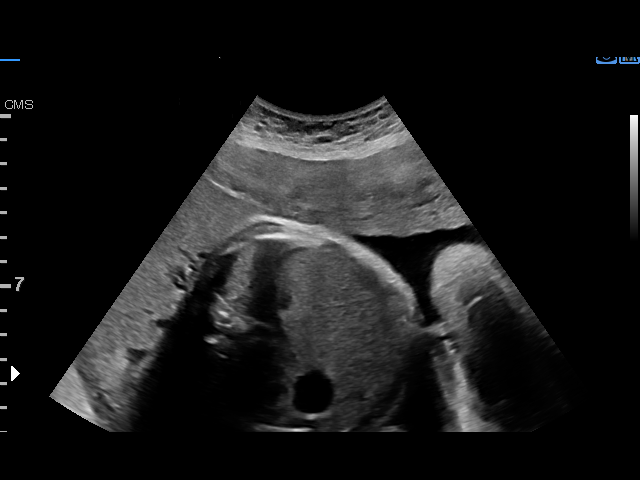
[im 26/37]
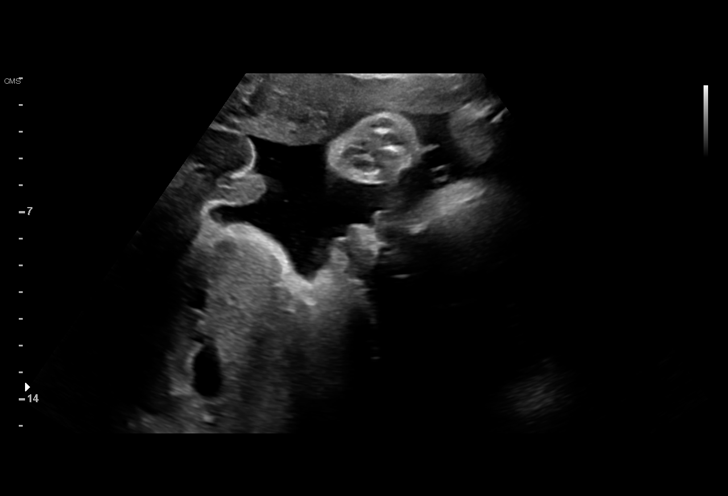
[im 29/37]
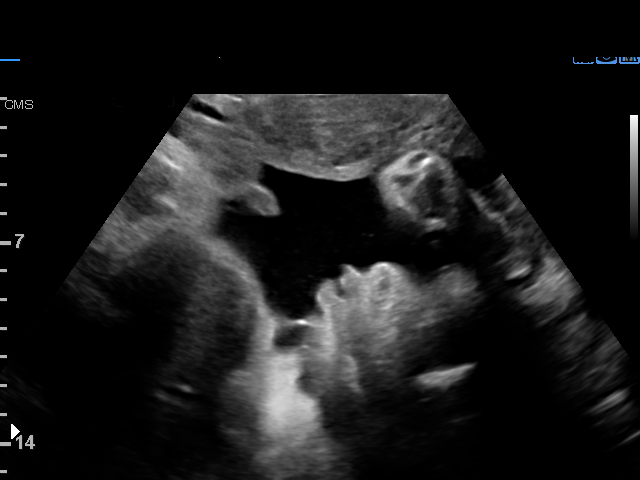
[im 31/37]
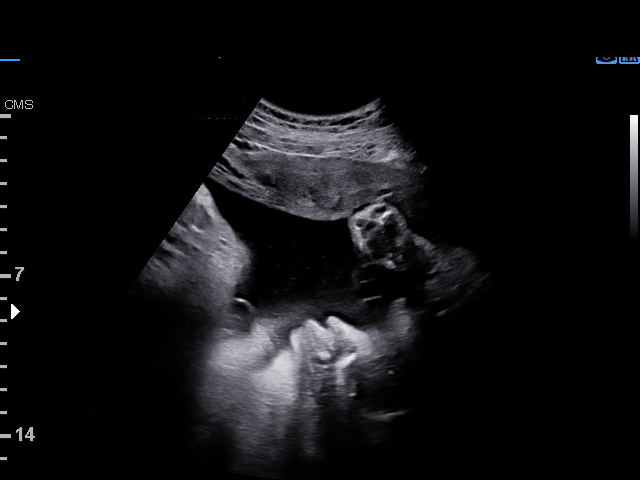
[im 34/37]
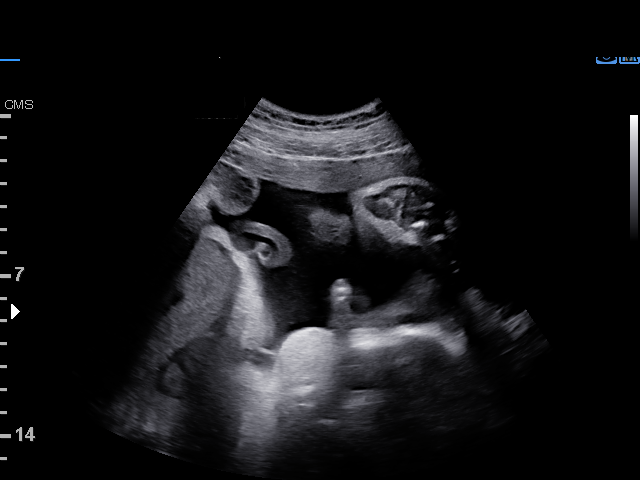
[im 37/37]
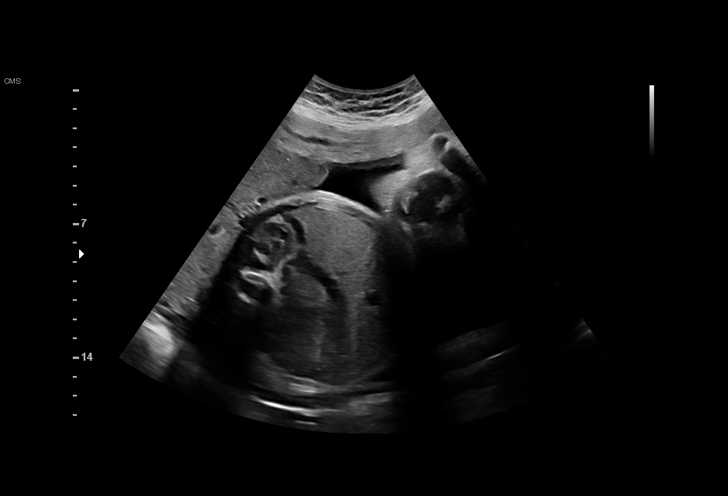

[15 of 28 positions shown; findings below may reference images not displayed]

OB/Gyn and
                                                            Infertility
                   OB/Gyn

Indications

 34 weeks gestation of pregnancy
 Hypertension - Chronic/Pre-existing
 (Procardia)
 History of cesarean delivery, currently
 pregnant
 Marijuana Use (THC)
 Low Risk NIPS, Neg horizon
 Obesity complicating pregnancy, third
 trimester (BMI 49)
 Uterine fibroids
 Encounter for other antenatal screening
 follow-up
Fetal Evaluation

 Num Of Fetuses:         1
 Fetal Heart Rate(bpm):  152
 Cardiac Activity:       Observed
 Presentation:           Cephalic
 Amniotic Fluid
 AFI FV:      Within normal limits

 AFI Sum(cm)     %Tile       Largest Pocket(cm)
 10.8            25

 RUQ(cm)       RLQ(cm)       LUQ(cm)        LLQ(cm)

Biophysical Evaluation

 Amniotic F.V:   Within normal limits       F. Tone:        Observed
 F. Movement:    Observed                   Score:          [DATE]
 F. Breathing:   Observed
Biometry

 LV:        4.5  mm
OB History

 Gravidity:    4         Term:   1        Prem:   0
 TOP:          1       Ectopic:  1        Living: 2
Gestational Age

 LMP:           34w 2d        Date:  08/10/20                 EDD:   05/17/21
 Best:          34w 2d     Det. By:  LMP  (08/10/20)          EDD:   05/17/21
Anatomy

 Ventricles:            Appears normal         Kidneys:                Appear normal
 Stomach:               Appears normal, left   Bladder:                Appears normal
                        sided
Myomas

 Site                     L(cm)      W(cm)      D(cm)       Location
 Anterior

 Blood Flow                  RI       PI       Comments

Impression

 Antenatal testing performed given maternal chronic
 hypertension
 The biophysical profile was [DATE] with good fetal movement and
 amniotic fluid volume.
Recommendations

 Continue weekly BPP.
 Growth is scheduled in 2 weeks.

## 2023-03-01 IMAGING — US US MFM FETAL BPP W/O NON-STRESS
1 series · 14 of 25 positions shown · non-contrast
Comparison: none

[Series 1: us mfm fetal bpp w/o non-stress · 25 acquisitions, 14 frames shown]
[im 1/25]
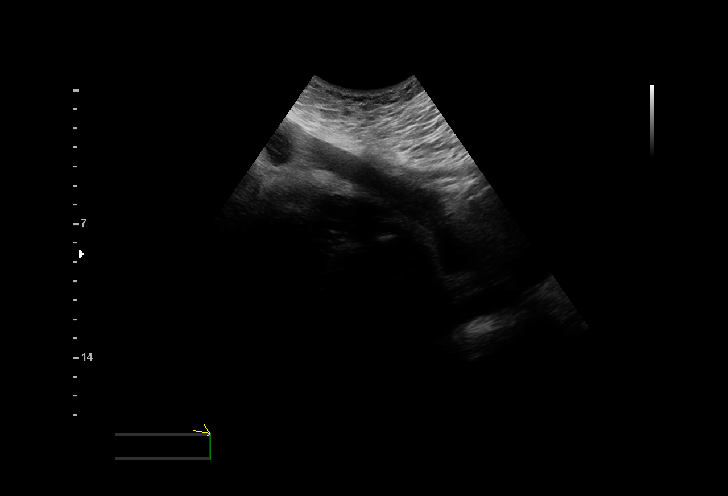
[im 3/25]
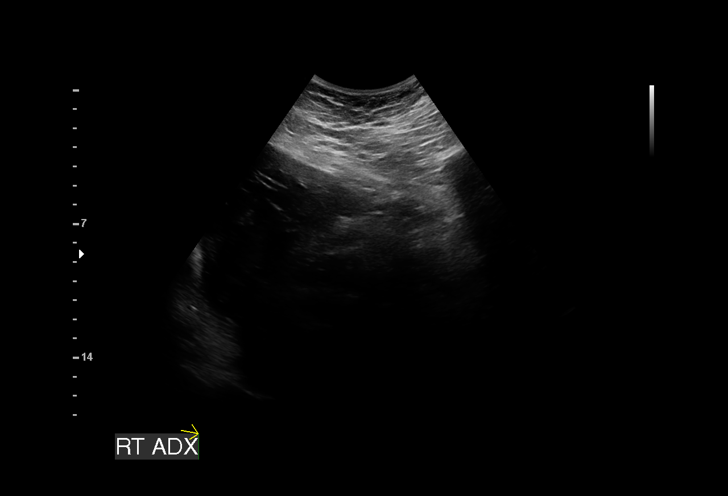
[im 5/25]
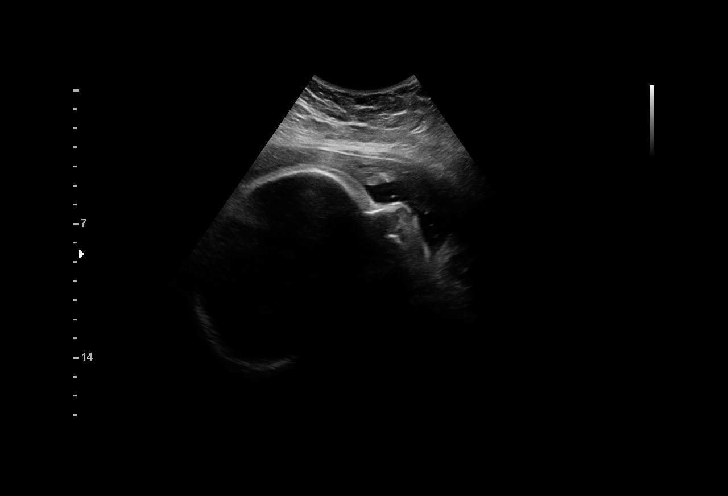
[im 7/25]
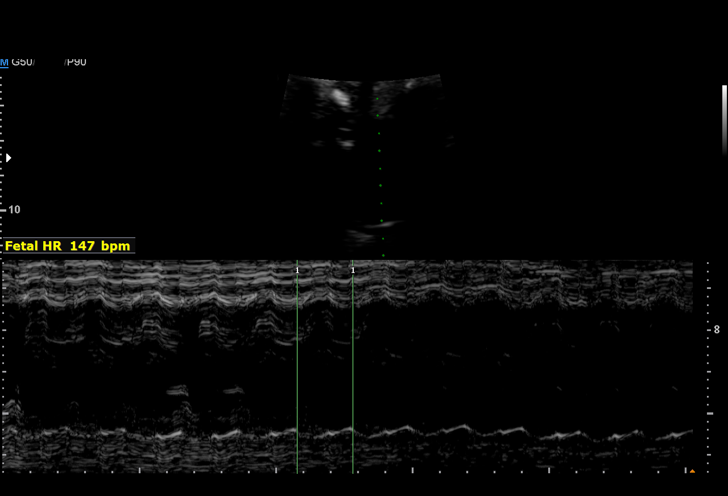
[im 9/25]
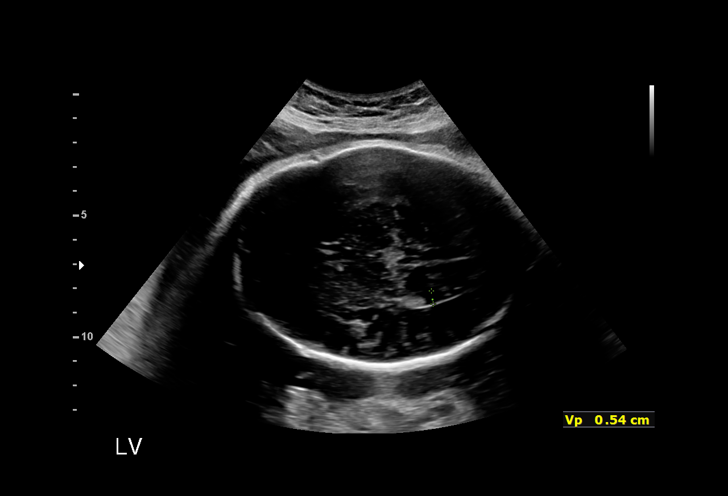
[im 10/25]
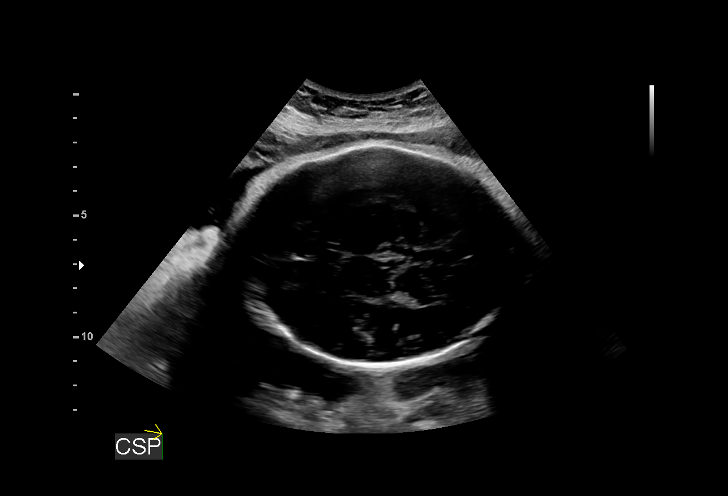
[im 12/25]
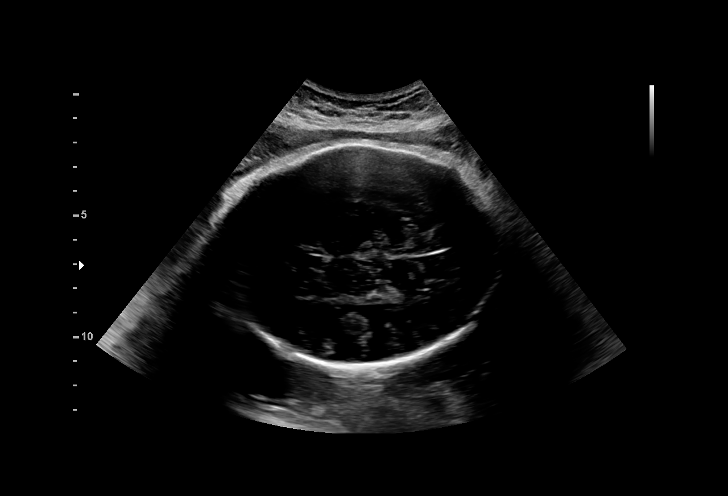
[im 14/25]
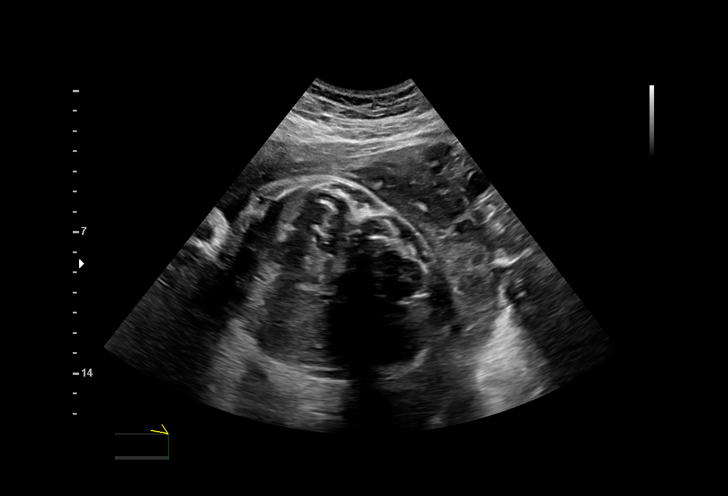
[im 16/25]
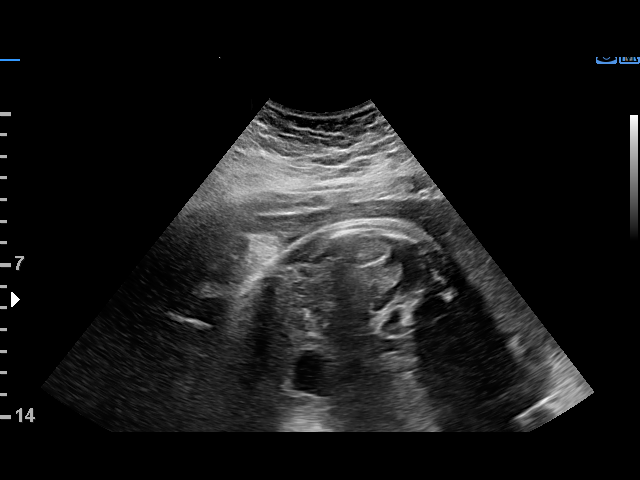
[im 17/25]
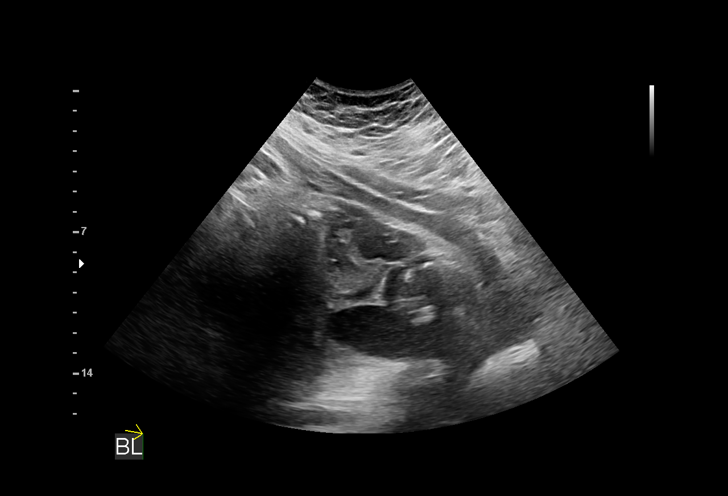
[im 19/25]
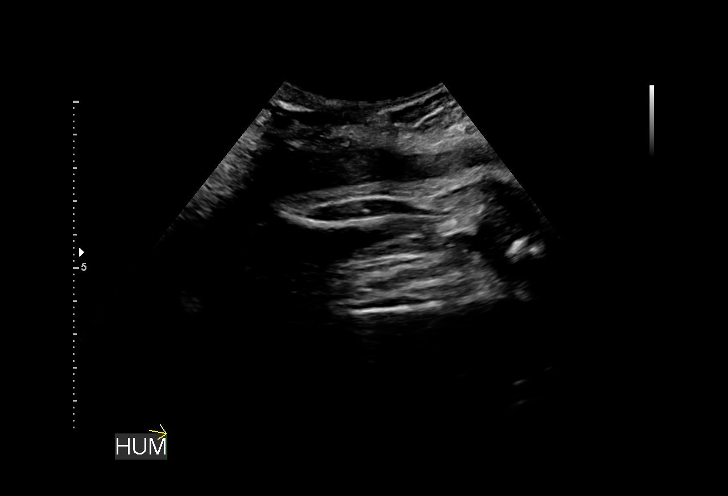
[im 21/25]
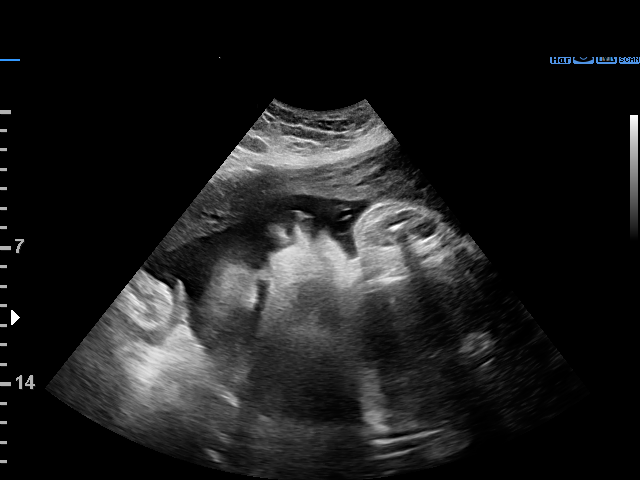
[im 23/25]
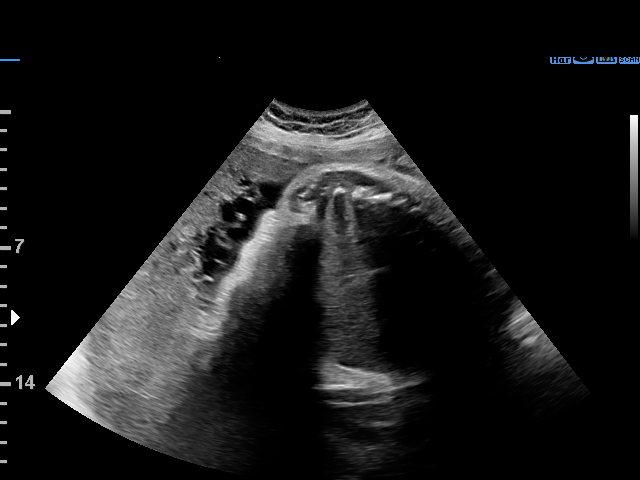
[im 25/25]
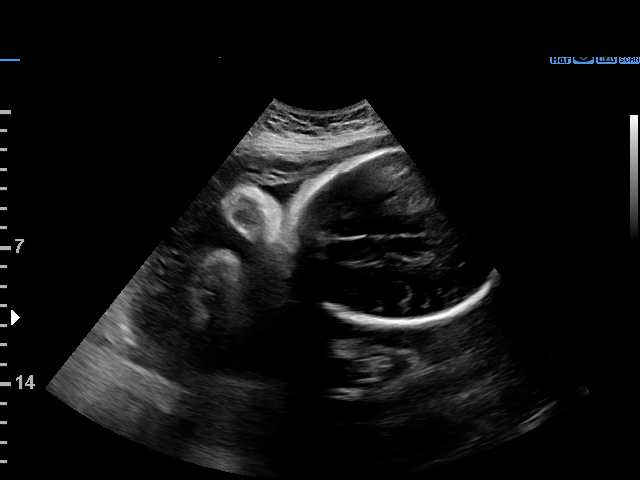

[14 of 25 positions shown; findings below may reference images not displayed]

OB/Gyn and
                                                            Infertility
                   OB/Gyn

Indications

 Hypertension - Chronic/Pre-existing
 (Procardia)
 37 weeks gestation of pregnancy
 History of cesarean delivery, currently
 pregnant
 Marijuana Use (THC)
 Low Risk NIPS, Neg horizon
 Obesity complicating pregnancy, third
 trimester (BMI 49)
 Uterine fibroids
Fetal Evaluation

 Num Of Fetuses:         1
 Preg. Location:         Intrauterine
 Fetal Heart Rate(bpm):  147
 Cardiac Activity:       Observed
 Presentation:           Breech
 Placenta:               Fundal
 Amniotic Fluid
 AFI FV:      Within normal limits

 AFI Sum(cm)     %Tile       Largest Pocket(cm)
 12.74           45

 RUQ(cm)       RLQ(cm)       LUQ(cm)        LLQ(cm)
 2.74          4.51          5.49           0
Biophysical Evaluation

 Amniotic F.V:   Within normal limits       F. Tone:        Observed
 F. Movement:    Observed                   Score:          [DATE]
 F. Breathing:   Observed
Biometry

 LV:        5.4  mm
OB History

 Gravidity:    4         Term:   1        Prem:   0
 TOP:          1       Ectopic:  1        Living: 2
Gestational Age

 LMP:           37w 2d        Date:  08/10/20                 EDD:   05/17/21
 Best:          37w 2d     Det. By:  LMP  (08/10/20)          EDD:   05/17/21
Anatomy

 Cranium:               Appears normal         Aortic Arch:            Previously seen
 Cavum:                 Appears normal         Ductal Arch:            Previously seen
 Ventricles:            Appears normal         Diaphragm:              Previously seen
 Choroid Plexus:        Previously seen        Stomach:                Appears normal, left
                                                                       sided
 Cerebellum:            Previously seen        Abdomen:                Previously seen
 Posterior Fossa:       Previously seen        Abdominal Wall:         Previously seen
 Nuchal Fold:           Previously seen        Cord Vessels:           Appears normal (3
                                                                       vessel cord)
 Face:                  Orbits and profile     Kidneys:                Appear normal
                        previously seen
 Lips:                  Previously seen        Bladder:                Appears normal
 Thoracic:              Previously seen        Spine:                  Limited views
                                                                       previously seen
 Heart:                 Limited Views          Upper Extremities:      Previously seen
 RVOT:                  Previously seen        Lower Extremities:      Previously seen
 LVOT:                  Previously seen

 Other:  Technically difficult due to maternal habitus and fetal position.
Cervix Uterus Adnexa

 Cervix
 Not visualized (advanced GA >24wks)
 Right Ovary
 Not visualized.

 Left Ovary
 Not visualized.
Comments

 This patient was seen for a biophysical profile due to chronic
 hypertension treated with Procardia.  She denies any
 problems since her last exam.
 A biophysical profile performed today was [DATE].
 The fetus was in the breech presentation today.
 There was normal amniotic fluid noted on today's ultrasound
 exam.
 The patient states that she already has a delivery scheduled
 on May 07, 2021.
 She will return in 1 week for another biophysical profile.  We
 will assess the fetal presentation at her next exam.

## 2024-07-04 LAB — OB RESULTS CONSOLE ABO/RH: RH Type: POSITIVE

## 2024-07-04 LAB — OB RESULTS CONSOLE RPR: RPR: NONREACTIVE

## 2024-07-04 LAB — OB RESULTS CONSOLE RUBELLA ANTIBODY, IGM: Rubella: IMMUNE

## 2024-07-04 LAB — HEPATITIS C ANTIBODY: HCV Ab: NEGATIVE

## 2024-07-04 LAB — OB RESULTS CONSOLE GC/CHLAMYDIA
Chlamydia: NEGATIVE
Neisseria Gonorrhea: NEGATIVE

## 2024-07-04 LAB — OB RESULTS CONSOLE HIV ANTIBODY (ROUTINE TESTING): HIV: NONREACTIVE

## 2024-07-04 LAB — OB RESULTS CONSOLE HEPATITIS B SURFACE ANTIGEN: Hepatitis B Surface Ag: NEGATIVE

## 2024-08-08 ENCOUNTER — Ambulatory Visit (HOSPITAL_BASED_OUTPATIENT_CLINIC_OR_DEPARTMENT_OTHER)

## 2024-08-08 ENCOUNTER — Encounter (HOSPITAL_BASED_OUTPATIENT_CLINIC_OR_DEPARTMENT_OTHER): Payer: Self-pay

## 2024-08-08 VITALS — BP 156/74 | HR 64 | Ht 67.0 in | Wt 312.0 lb

## 2024-08-08 DIAGNOSIS — O10913 Unspecified pre-existing hypertension complicating pregnancy, third trimester: Secondary | ICD-10-CM

## 2024-08-08 DIAGNOSIS — Z3A29 29 weeks gestation of pregnancy: Secondary | ICD-10-CM | POA: Diagnosis not present

## 2024-08-08 DIAGNOSIS — I10 Essential (primary) hypertension: Secondary | ICD-10-CM

## 2024-08-08 DIAGNOSIS — Z3480 Encounter for supervision of other normal pregnancy, unspecified trimester: Secondary | ICD-10-CM | POA: Insufficient documentation

## 2024-08-08 DIAGNOSIS — Z23 Encounter for immunization: Secondary | ICD-10-CM | POA: Diagnosis not present

## 2024-08-08 MED ORDER — GOJJI WEIGHT SCALE MISC: 1.0000 | 0 refills | Status: AC

## 2024-08-08 MED ORDER — BLOOD PRESSURE KIT DEVI: 1.0000 | Freq: Every day | 0 refills | Status: AC

## 2024-08-08 MED ORDER — NIFEDIPINE ER OSMOTIC RELEASE 30 MG PO TB24: 30.0000 mg | ORAL_TABLET | Freq: Every day | ORAL | 2 refills | Status: DC

## 2024-08-08 NOTE — Progress Notes (Unsigned)
 NURSE VISIT- PREGNANCY CONFIRMATION   SUBJECTIVE:  Charlene Mora is a 28 y.o. [phone] female at [redacted]w[redacted]d by ultrasound dating. Patient's last menstrual period was 01/21/2024 (exact date). Patient is a transfer of care from [facility]. Patient has had an OB ultrasound with [facility] on 07/03/2024 (results scanned in). She reports no complaints.  She is taking prenatal vitamins.    I explained I am completing New OB Intake today. We discussed EDD of 10/20/2024 based on viability ultrasound performed at Pt is [id number]. I reviewed her allergies, medications and Medical/Surgical/OB history.    Patient Active Problem List   Diagnosis Date Noted   Chronic hypertension during pregnancy 05/07/2021    Concerns addressed today  MyChart/Babyscripts MyChart access verified. I explained pt will have some visits in office and some virtually. Babyscripts instructions given and order placed. Patient verifies receipt of registration text/e-mail. Account successfully created and app downloaded.   Blood Pressure Cuff/Weight Scale Blood pressure cuff ordered for patient to pick-up from [employer]. Explained after first prenatal appt pt will check weekly and document in Babyscripts. Patient does not have weight scale; order sent to [facility], patient may track weight weekly in Babyscripts.  Is patient a candidate for Babyscripts Optimization? Yes, patient accepted   Last Pap 07/03/2024 NILM HRHPV Neg  OBJECTIVE:  BP (!) 156/74 (BP Location: Right Arm, Patient Position: Sitting, Cuff Size: Large)   Pulse 64   Ht 5' 7 (1.702 m)   Wt (!) 312 lb (141.5 kg)   LMP 01/21/2024 (Exact Date)   SpO2 100%   BMI 48.87 kg/m   Appears well, in no apparent distress  ASSESSMENT: Positive pregnancy test. LMP 01/21/2024. EDD based on ultrasound dating 10/20/2024.    PLAN: Prenatal vitamins: continue   Nausea medicines: not currently needed New OB packet  provided and reviewed with patient. Advised to bring completed paperwork back to her new OB appointment. New OB appointment scheduled for 08/29/2024 at 10:55 am with Arland Roller, CNM. Patient's care reviewed with Dr.Miller. Tdap vaccine administered today. Procardia  30 mg daily #30 2RF sent to pharmacy on file for patient to start today. Lab work for CBC, CMET, hemoglobin A1c, HIV, RPR, and protein creat ratio to be performed today. GTT ordered to be completed 08/09/2024.   Charlene Mora E, RN 08/08/2024  5:18 PM

## 2024-08-09 ENCOUNTER — Ambulatory Visit (HOSPITAL_BASED_OUTPATIENT_CLINIC_OR_DEPARTMENT_OTHER): Payer: Self-pay | Admitting: Obstetrics & Gynecology

## 2024-08-09 ENCOUNTER — Other Ambulatory Visit (HOSPITAL_BASED_OUTPATIENT_CLINIC_OR_DEPARTMENT_OTHER)

## 2024-08-09 LAB — PROTEIN / CREATININE RATIO, URINE
Creatinine, Urine: 39.2 mg/dL
Protein, Ur: 7.8 mg/dL
Protein/Creat Ratio: 199 mg/g{creat} (ref 0–200)

## 2024-08-09 LAB — COMPREHENSIVE METABOLIC PANEL WITH GFR
ALT: 8 IU/L (ref 0–32)
AST: 13 IU/L (ref 0–40)
Albumin: 3.7 g/dL — ABNORMAL LOW (ref 4.0–5.0)
Alkaline Phosphatase: 73 IU/L (ref 41–116)
BUN/Creatinine Ratio: 12 (ref 9–23)
BUN: 7 mg/dL (ref 6–20)
Bilirubin Total: 0.2 mg/dL (ref 0.0–1.2)
CO2: 21 mmol/L (ref 20–29)
Calcium: 9.4 mg/dL (ref 8.7–10.2)
Chloride: 101 mmol/L (ref 96–106)
Creatinine, Ser: 0.57 mg/dL (ref 0.57–1.00)
Globulin, Total: 3 g/dL (ref 1.5–4.5)
Glucose: 78 mg/dL (ref 70–99)
Potassium: 4.6 mmol/L (ref 3.5–5.2)
Sodium: 135 mmol/L (ref 134–144)
Total Protein: 6.7 g/dL (ref 6.0–8.5)
eGFR: 125 mL/min/1.73 (ref >59)

## 2024-08-09 LAB — CBC
Hematocrit: 32.3 % — ABNORMAL LOW (ref 34.0–46.6)
Hemoglobin: 10.3 g/dL — ABNORMAL LOW (ref 11.1–15.9)
MCH: 25.9 pg — ABNORMAL LOW (ref 26.6–33.0)
MCHC: 31.9 g/dL (ref 31.5–35.7)
MCV: 81 fL (ref 79–97)
Platelets: 227 x10E3/uL (ref 150–450)
RBC: 3.97 x10E6/uL (ref 3.77–5.28)
RDW: 14.5 % (ref 11.7–15.4)
WBC: 13.5 x10E3/uL — ABNORMAL HIGH (ref 3.4–10.8)

## 2024-08-09 LAB — RPR: RPR Ser Ql: NONREACTIVE

## 2024-08-09 LAB — HIV ANTIBODY (ROUTINE TESTING W REFLEX): HIV Screen 4th Generation wRfx: NONREACTIVE

## 2024-08-09 LAB — HEMOGLOBIN A1C
Est. average glucose Bld gHb Est-mCnc: 111 mg/dL
Hgb A1c MFr Bld: 5.5 % (ref 4.8–5.6)

## 2024-08-10 LAB — GLUCOSE TOLERANCE, 2 HOURS W/ 1HR
Glucose, 1 hour: 142 mg/dL (ref 70–179)
Glucose, 2 hour: 114 mg/dL (ref 70–152)
Glucose, Fasting: 80 mg/dL (ref 70–91)

## 2024-08-13 ENCOUNTER — Encounter (HOSPITAL_BASED_OUTPATIENT_CLINIC_OR_DEPARTMENT_OTHER): Payer: Self-pay | Admitting: Certified Nurse Midwife

## 2024-08-14 ENCOUNTER — Telehealth: Payer: Self-pay | Admitting: Obstetrics and Gynecology

## 2024-08-14 NOTE — Telephone Encounter (Signed)
 OB Telephone Note 08/14/2024 1900 I received a baby scripts call stating that this patient had a BP of 164/78 and no s/s.  I called her and she states she took that BP at 1700 and no repeats; she confirms no s/s of pre-eclampsia and no PTL or decreased FM s/s. She states she started on her procardia  30 qday yesterday and she last took it at noon today; it looks like it was sent in on 11/12, which she confirms, when she was 156/74 at her nurse visit as a transfer from [facility]; a h/o CHTN was noted. She states she wasn't on anything for BP prior to this  I asked her to take another BP and she stated it was 172/90. She states she that she got her BP cuff yesterday.  I told her I recommend coming to the hospital for a pre-eclampsia evaluation and to bring her BP cuff  Bebe Izell Raddle MD Attending Center for [employer] (Faculty Practice) 08/14/2024 Time: [phone]

## 2024-08-15 ENCOUNTER — Telehealth (HOSPITAL_BASED_OUTPATIENT_CLINIC_OR_DEPARTMENT_OTHER): Payer: Self-pay

## 2024-08-15 NOTE — Telephone Encounter (Signed)
 Spoke with patient. Advised I saw that Dr.Pickens called her last night regarding a high BP reading entered into babyscripts at 1700 which was 164/78. Dr.Pickens recommended that she proceed to MAU for additional evaluation. Patient was not seen in MAU. Reports she was unable to go as she had her children last night. Patient has taken her BP today and it was 143/74. Denies any headaches, vision changes, numbness/tingling, or upper right quadrant pain. Patient states she started taking Procardia  30 mg daily Monday. Appointment scheduled for BP recheck in our office 08/16/2024 at 4:15 pm. Patient is unable to come in for an earlier appointment. Patient is agreeable to date and time. Advised if patient has an elevated BP with vision changes, headaches, numbness/tingling, or upper right quadrant pain needs to be seen at MAU. Patient verbalizes understanding.  Routing to Dr.Miller for review.

## 2024-08-16 ENCOUNTER — Ambulatory Visit (HOSPITAL_BASED_OUTPATIENT_CLINIC_OR_DEPARTMENT_OTHER)

## 2024-08-16 ENCOUNTER — Encounter (HOSPITAL_BASED_OUTPATIENT_CLINIC_OR_DEPARTMENT_OTHER): Payer: Self-pay

## 2024-08-16 VITALS — BP 134/68 | HR 84 | Ht 67.0 in | Wt 310.6 lb

## 2024-08-16 DIAGNOSIS — Z0130 Encounter for examination of blood pressure without abnormal findings: Secondary | ICD-10-CM

## 2024-08-16 NOTE — Progress Notes (Signed)
 NURSE VISIT- BLOOD PRESSURE CHECK  SUBJECTIVE:  Charlene Mora is a 28 y.o. [phone] female here for BP check. She is [redacted]w[redacted]d pregnant    HYPERTENSION ROS:  Pregnant/postpartum:  Severe headaches that don't go away with tylenol /other medicines: Yes  Visual changes (seeing spots/double/blurred vision) No  Severe pain under right breast breast or in center of upper chest No  Severe nausea/vomiting No  Taking medicines as instructed yes, but did not take meds today  GYN patient: Taking medicines as instructed yes, but did not take today Headaches  No Chest pain No Shortness of breath No Swelling in legs/ankles No  OBJECTIVE:  BP (!) 163/68   Pulse 77   Ht 5' 7 (1.702 m) Comment: Reported  Wt (!) 310 lb 9.6 oz (140.9 kg)   LMP 01/21/2024 (Exact Date)   BMI 48.65 kg/m   Appearance alert, well appearing, and in no distress.  ASSESSMENT: Pregnancy [redacted]w[redacted]d  blood pressure check  PLAN: Discussed with Recommendations: no changes needed   Follow-up: as scheduled

## 2024-08-29 ENCOUNTER — Ambulatory Visit (HOSPITAL_BASED_OUTPATIENT_CLINIC_OR_DEPARTMENT_OTHER): Admitting: Certified Nurse Midwife

## 2024-08-29 VITALS — BP 138/76 | HR 71 | Wt 307.2 lb

## 2024-08-29 DIAGNOSIS — O99213 Obesity complicating pregnancy, third trimester: Secondary | ICD-10-CM

## 2024-08-29 DIAGNOSIS — O10013 Pre-existing essential hypertension complicating pregnancy, third trimester: Secondary | ICD-10-CM

## 2024-08-29 DIAGNOSIS — O10019 Pre-existing essential hypertension complicating pregnancy, unspecified trimester: Secondary | ICD-10-CM | POA: Insufficient documentation

## 2024-08-29 DIAGNOSIS — Z3480 Encounter for supervision of other normal pregnancy, unspecified trimester: Secondary | ICD-10-CM

## 2024-08-29 DIAGNOSIS — O98313 Other infections with a predominantly sexual mode of transmission complicating pregnancy, third trimester: Secondary | ICD-10-CM

## 2024-08-29 DIAGNOSIS — A6000 Herpesviral infection of urogenital system, unspecified: Secondary | ICD-10-CM

## 2024-08-29 DIAGNOSIS — O34211 Maternal care for low transverse scar from previous cesarean delivery: Secondary | ICD-10-CM | POA: Diagnosis not present

## 2024-08-29 DIAGNOSIS — Z3A31 31 weeks gestation of pregnancy: Secondary | ICD-10-CM | POA: Diagnosis not present

## 2024-08-29 DIAGNOSIS — O43193 Other malformation of placenta, third trimester: Secondary | ICD-10-CM | POA: Insufficient documentation

## 2024-08-29 DIAGNOSIS — O34219 Maternal care for unspecified type scar from previous cesarean delivery: Secondary | ICD-10-CM | POA: Insufficient documentation

## 2024-08-29 NOTE — Progress Notes (Addendum)
 RTO in 1 week for Return Prenatal Visit and NST. Message/order sent to MFM-pt needs US  for Growth.      PRENATAL VISIT NOTE  Subjective:  Charlene Mora is a 28 y.o. H4E7976 at [redacted]w[redacted]d being seen today for ongoing prenatal care.  She is currently monitored for the following issues for this high-risk pregnancy and has Chronic hypertension during pregnancy; Supervision of other normal pregnancy, antepartum; Essential hypertension during pregnancy; Maternal morbid obesity in third trimester, antepartum (HCC); and Genital herpes simplex on their problem list.  Patient reports no bleeding, no contractions, no cramping, and no leaking.  Contractions: Irregular. Vag. Bleeding: None.  Movement: Present. Denies leaking of fluid.   The following portions of the patient's history were reviewed and updated as appropriate: allergies, current medications, past family history, past medical history, past social history, past surgical history and problem list.   Objective:   Vitals:   08/29/24 1130  BP: 138/76  Pulse: 71  Weight: (!) 307 lb 3.2 oz (139.3 kg)    Fetal Status:  Fetal Heart Rate (bpm): 140 Fundal Height: 34 cm Movement: Present    General: Alert, oriented and cooperative. Patient is in no acute distress.  Skin: Skin is warm and dry. No rash noted.   Cardiovascular: Normal heart rate noted  Respiratory: Normal respiratory effort, no problems with respiration noted  Abdomen: Soft, gravid, appropriate for gestational age.  Pain/Pressure: Present     Pelvic: Cervical exam deferred        Extremities: Normal range of motion.  Edema: None  Mental Status: Normal mood and affect. Normal behavior. Normal judgment and thought content.       No data to display               No data to display          Assessment and Plan:  Pregnancy: H4E7976 at [redacted]w[redacted]d  1. Genital herpes simplex, unspecified site  - Start Valtrex 500mg  po BID at 36 weeks and prn  2. Supervision of other  normal pregnancy, antepartum - Pt taking PNV - US  MFM OB DETAIL +14 WK; Future - Declines Flu Vaccine - Pt desires Repeat Scheduled LTCS. Does not desire sterilization. Not interested in laboring. - Pt needs Fasting 2hr GTT (message to RN to scheduled)  3. Maternal morbid obesity in third trimester, antepartum (HCC) - PreGravid BMI 48 - Order sent for US  for Growth, Complete Anatomy. - Start antenatal testing in 1 week at 32 weeks (scheduled) - US  MFM OB DETAIL +14 WK; Future  4. Essential hypertension during pregnancy - BP 138/76. Pt taking Procardia  XL 30mg  daily - Plan baseline Urine Protein/Cr.Clearance - Baseline PreE labs - US  MFM OB DETAIL +14 WK; Future  5. [redacted] weeks gestation of pregnancy  6. Prior LTCS x 2 - Schedule for Repeat LTCS after MFM Consult  Preterm labor symptoms and general obstetric precautions including but not limited to vaginal bleeding, contractions, leaking of fluid and fetal movement were reviewed in detail with the patient. Please refer to After Visit Summary for other counseling recommendations.   Return in 1 week and weekly for return prenatal visit and NSTs.  Future Appointments  Date Time Provider Department Center  09/05/2024  2:15 PM Tad Arland POUR, CNM [facility] [address]  09/05/2024  3:00 PM [facility]YN NURSE [facility] [address]  09/12/2024  2:35 PM Mateusz Neilan, Arland POUR, CNM [facility] [address]  09/12/2024  3:00 PM [facility] NURSE [facility]  09/17/2024  3:15  PM Tad Arland POUR, CNM [facility] [address]  09/17/2024  4:00 PM [facility] NURSE [facility] [address]    Arland POUR Tad, CNM    Culler, Freeda LITTIE Tad Arland POUR, CNM; P [facility]er[facility] Patient on the wait list       Previous Messages    ----- Message ----- From: Tad Arland POUR, CNM Sent: 08/29/2024  12:06 PM EST To: [facility] Subject: US  for Growth                                   Please schedule US  for Growth 31w4 Maternal morbid obesity Essential  HTN (on medication) Prior LTCS x2  Order is in. She transferred in at 31wk.

## 2024-08-30 ENCOUNTER — Other Ambulatory Visit (HOSPITAL_BASED_OUTPATIENT_CLINIC_OR_DEPARTMENT_OTHER)

## 2024-09-05 ENCOUNTER — Ambulatory Visit (HOSPITAL_BASED_OUTPATIENT_CLINIC_OR_DEPARTMENT_OTHER): Admitting: Certified Nurse Midwife

## 2024-09-05 ENCOUNTER — Other Ambulatory Visit (HOSPITAL_BASED_OUTPATIENT_CLINIC_OR_DEPARTMENT_OTHER): Payer: Self-pay

## 2024-09-05 VITALS — BP 141/71 | HR 86 | Wt 312.8 lb

## 2024-09-05 DIAGNOSIS — A6000 Herpesviral infection of urogenital system, unspecified: Secondary | ICD-10-CM | POA: Diagnosis not present

## 2024-09-05 DIAGNOSIS — O99213 Obesity complicating pregnancy, third trimester: Secondary | ICD-10-CM | POA: Diagnosis not present

## 2024-09-05 DIAGNOSIS — Z3480 Encounter for supervision of other normal pregnancy, unspecified trimester: Secondary | ICD-10-CM

## 2024-09-05 DIAGNOSIS — O10013 Pre-existing essential hypertension complicating pregnancy, third trimester: Secondary | ICD-10-CM | POA: Diagnosis not present

## 2024-09-05 DIAGNOSIS — O43193 Other malformation of placenta, third trimester: Secondary | ICD-10-CM

## 2024-09-05 DIAGNOSIS — O34219 Maternal care for unspecified type scar from previous cesarean delivery: Secondary | ICD-10-CM

## 2024-09-05 DIAGNOSIS — O34211 Maternal care for low transverse scar from previous cesarean delivery: Secondary | ICD-10-CM | POA: Diagnosis not present

## 2024-09-05 DIAGNOSIS — O10019 Pre-existing essential hypertension complicating pregnancy, unspecified trimester: Secondary | ICD-10-CM

## 2024-09-05 DIAGNOSIS — O98313 Other infections with a predominantly sexual mode of transmission complicating pregnancy, third trimester: Secondary | ICD-10-CM

## 2024-09-05 DIAGNOSIS — Z3A32 32 weeks gestation of pregnancy: Secondary | ICD-10-CM | POA: Diagnosis not present

## 2024-09-05 MED ORDER — NIFEDIPINE ER OSMOTIC RELEASE 60 MG PO TB24: 60.0000 mg | ORAL_TABLET | Freq: Every day | ORAL | 1 refills | Status: DC

## 2024-09-05 NOTE — Progress Notes (Signed)
° ° °  PRENATAL VISIT NOTE  Subjective:  Charlene Mora is a 28 y.o. H4E7976 at [redacted]w[redacted]d being seen today for ongoing prenatal care.  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  Patient reports no bleeding, no contractions, no cramping, and no leaking.  Contractions: Irregular. Vag. Bleeding: None.  Movement: Present. Denies leaking of fluid.   The following portions of the patient's history were reviewed and updated as appropriate: allergies, current medications, past family history, past medical history, past social history, past surgical history and problem list.   Objective:   Vitals:   09/05/24 1437  BP: (!) 141/71  Pulse: 86  Weight: (!) 312 lb 12.8 oz (141.9 kg)    Fetal Status:  Fetal Heart Rate (bpm): 144   Movement: Present    General: Alert, oriented and cooperative. Patient is in no acute distress.  Skin: Skin is warm and dry. No rash noted.   Cardiovascular: Normal heart rate noted  Respiratory: Normal respiratory effort, no problems with respiration noted  Abdomen: Soft, gravid, appropriate for gestational age.  Pain/Pressure: Present (pressure)     Pelvic: Cervical exam deferred        Extremities: Normal range of motion.  Edema: None  Mental Status: Normal mood and affect. Normal behavior. Normal judgment and thought content.       No data to display               No data to display          Assessment and Plan:  Pregnancy: H4E7976 at [redacted]w[redacted]d  1. Supervision of other normal pregnancy, antepartum (Primary) - Pt transfer in from [employer] [facility]. US  on 07/03/24 indicated EDD by LMP of 10/27/2024. - Pt scheduled for US   09/07/24 w/ MFM Consult  2. Essential hypertension during pregnancy - Taking Procardia  XL 30mg  po once daily (will increase to 60mg  XL once daily) - Baseline PreE labs completed - Pt has US  09/07/24 and weekly antenatal testing thereafter  3. Genital herpes simplex, unspecified site - Start Valtrex 500mg  po BID at 36 weeks  4. Maternal morbid obesity in third trimester, antepartum (HCC) - Pt passed 2hr GTT (No evidence of GDM)  5. 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 Previous cesarean delivery, antepartum - Desires Repeat Scheduled LTCS  Preterm labor symptoms and general obstetric precautions including but not limited to vaginal bleeding, contractions, leaking of fluid and fetal movement were reviewed in detail with the patient. Please refer to After Visit Summary for other counseling recommendations.   No follow-ups on file.  Future Appointments  Date Time Provider Department Center  09/07/2024 10:00 AM [facility] PROVIDER 1 [facility]  09/07/2024 10:30 AM [facility] US2 [facility]  09/12/2024  2:35 PM Deidra Spease, Arland POUR, CNM [facility] [address]  09/12/2024  3:00 PM [facility]BGYN NST [facility] [address]  09/17/2024  3:15 PM Danyia Borunda, Arland POUR, CNM [facility] [address]  09/17/2024  4:00 PM [facility]BG[facility] NST [facility] [id number]    Arland POUR Roller, CNM

## 2024-09-06 ENCOUNTER — Telehealth (HOSPITAL_BASED_OUTPATIENT_CLINIC_OR_DEPARTMENT_OTHER): Payer: Self-pay

## 2024-09-06 NOTE — Telephone Encounter (Signed)
-----   Message from Arland MARLA Roller sent at 09/05/2024  3:43 PM EST ----- Regarding: Increase in Procardia  XL After she left, I decided to increase her Procardia  from 30mg  Xl daily to 60mg  XL daily. Please let the patient know that we are going to increase her dosage.  Arland MARLA Roller

## 2024-09-06 NOTE — Telephone Encounter (Signed)
 Left message to call Nayab Aten at [phone].

## 2024-09-07 ENCOUNTER — Other Ambulatory Visit

## 2024-09-07 ENCOUNTER — Ambulatory Visit

## 2024-09-07 NOTE — Telephone Encounter (Signed)
 Left message to call Nayab Aten at [phone].

## 2024-09-12 ENCOUNTER — Ambulatory Visit (HOSPITAL_BASED_OUTPATIENT_CLINIC_OR_DEPARTMENT_OTHER): Admitting: Certified Nurse Midwife

## 2024-09-12 ENCOUNTER — Other Ambulatory Visit (HOSPITAL_BASED_OUTPATIENT_CLINIC_OR_DEPARTMENT_OTHER): Payer: Self-pay

## 2024-09-12 ENCOUNTER — Encounter (HOSPITAL_BASED_OUTPATIENT_CLINIC_OR_DEPARTMENT_OTHER): Payer: Self-pay | Admitting: Certified Nurse Midwife

## 2024-09-12 VITALS — BP 141/72 | HR 83 | Wt 310.6 lb

## 2024-09-12 DIAGNOSIS — O10019 Pre-existing essential hypertension complicating pregnancy, unspecified trimester: Secondary | ICD-10-CM

## 2024-09-12 DIAGNOSIS — O34211 Maternal care for low transverse scar from previous cesarean delivery: Secondary | ICD-10-CM | POA: Diagnosis not present

## 2024-09-12 DIAGNOSIS — A6000 Herpesviral infection of urogenital system, unspecified: Secondary | ICD-10-CM | POA: Diagnosis not present

## 2024-09-12 DIAGNOSIS — O98313 Other infections with a predominantly sexual mode of transmission complicating pregnancy, third trimester: Secondary | ICD-10-CM

## 2024-09-12 DIAGNOSIS — O43193 Other malformation of placenta, third trimester: Secondary | ICD-10-CM | POA: Diagnosis not present

## 2024-09-12 DIAGNOSIS — Z3A33 33 weeks gestation of pregnancy: Secondary | ICD-10-CM

## 2024-09-12 DIAGNOSIS — O99213 Obesity complicating pregnancy, third trimester: Secondary | ICD-10-CM | POA: Diagnosis not present

## 2024-09-12 DIAGNOSIS — Z3480 Encounter for supervision of other normal pregnancy, unspecified trimester: Secondary | ICD-10-CM

## 2024-09-12 DIAGNOSIS — O10013 Pre-existing essential hypertension complicating pregnancy, third trimester: Secondary | ICD-10-CM | POA: Diagnosis not present

## 2024-09-12 DIAGNOSIS — O34219 Maternal care for unspecified type scar from previous cesarean delivery: Secondary | ICD-10-CM

## 2024-09-12 NOTE — Progress Notes (Signed)
° °  PRENATAL VISIT NOTE  Subjective:  Charlene Mora is a 28 y.o. H4E7976 at [redacted]w[redacted]d being seen today for ongoing prenatal care.  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  Patient reports no bleeding, no cramping, no leaking, and occasional contractions.  Contractions: Not present. Vag. Bleeding: None.  Movement: Present. Denies leaking of fluid.   The following portions of the patient's history were reviewed and updated as appropriate: allergies, current medications, past family history, past medical history, past social history, past surgical history and problem list.   Objective:   Vitals:   09/12/24 1456  BP: (!) 141/72  Pulse: 83  Weight: (!) 310 lb 9.6 oz (140.9 kg)    Fetal Status:      Movement: Present    General: Alert, oriented and cooperative. Patient is in no acute distress.  Skin: Skin is warm and dry. No rash noted.   Cardiovascular: Normal heart rate noted  Respiratory: Normal respiratory effort, no problems with respiration noted  Abdomen: Soft, gravid, appropriate for gestational age.  Pain/Pressure: Present     Pelvic: Cervical exam deferred        Extremities: Normal range of motion.  Edema: None  Mental Status: Normal mood and affect. Normal behavior. Normal judgment and thought content.       No data to display               No data to display          Assessment and Plan:  Pregnancy: H4E7976 at [redacted]w[redacted]d  1. Supervision of other normal pregnancy, antepartum (Primary) - Pt transfer in from [employer]. US  on 07/03/24 indicated EDD by LMP of 10/27/2024. - Pt scheduled for US  09/14/24 w/ MFM Consult - Pt  states (09/12/24) she is UTD on Flu Vaccine - Tdap Vaccine UTD - Discussed recommendation for RSV Vaccine today  - NST Reactive   2. Essential hypertension during pregnancy - Taking Procardia  XL 60mg  po once daily - Baseline PreE labs completed - Pt has US  09/07/24 and weekly antenatal testing thereafter   3. Genital herpes simplex, unspecified site - Start Valtrex 500mg  po BID at 36 weeks   4. Maternal morbid obesity in third trimester, antepartum (HCC) - Pt passed 2hr GTT (No evidence of GDM)   5. 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 Previous cesarean delivery, antepartum - Desires Repeat Scheduled LTCS  Preterm labor symptoms and general obstetric precautions including but not limited to vaginal bleeding, contractions, leaking of fluid and fetal movement were reviewed in detail with the patient. Please refer to After Visit Summary for other counseling recommendations.   No follow-ups on file.  Future Appointments  Date Time Provider Department Center  09/14/2024 11:00 AM [facility] PROVIDER 1 [facility]  09/14/2024 11:30 AM [facility] US4 [facility]  09/17/2024  3:15 PM Diany Formosa, Arland POUR, CNM [facility] [address]  09/17/2024  4:00 PM [facility]YN NST [facility] [address]    Arland POUR Roller, CNM

## 2024-09-14 ENCOUNTER — Other Ambulatory Visit

## 2024-09-14 ENCOUNTER — Ambulatory Visit: Attending: Obstetrics and Gynecology | Admitting: Obstetrics

## 2024-09-14 VITALS — BP 142/67 | HR 86

## 2024-09-14 DIAGNOSIS — O403XX0 Polyhydramnios, third trimester, not applicable or unspecified: Secondary | ICD-10-CM

## 2024-09-14 DIAGNOSIS — O10013 Pre-existing essential hypertension complicating pregnancy, third trimester: Secondary | ICD-10-CM | POA: Insufficient documentation

## 2024-09-14 DIAGNOSIS — O10019 Pre-existing essential hypertension complicating pregnancy, unspecified trimester: Secondary | ICD-10-CM

## 2024-09-14 DIAGNOSIS — O99213 Obesity complicating pregnancy, third trimester: Secondary | ICD-10-CM

## 2024-09-14 DIAGNOSIS — Z3A34 34 weeks gestation of pregnancy: Secondary | ICD-10-CM | POA: Diagnosis not present

## 2024-09-14 DIAGNOSIS — O133 Gestational [pregnancy-induced] hypertension without significant proteinuria, third trimester: Secondary | ICD-10-CM

## 2024-09-14 DIAGNOSIS — A6000 Herpesviral infection of urogenital system, unspecified: Secondary | ICD-10-CM

## 2024-09-14 DIAGNOSIS — Z3A33 33 weeks gestation of pregnancy: Secondary | ICD-10-CM

## 2024-09-14 DIAGNOSIS — E66813 Obesity, class 3: Secondary | ICD-10-CM

## 2024-09-14 DIAGNOSIS — O409XX0 Polyhydramnios, unspecified trimester, not applicable or unspecified: Secondary | ICD-10-CM | POA: Insufficient documentation

## 2024-09-14 DIAGNOSIS — O34219 Maternal care for unspecified type scar from previous cesarean delivery: Secondary | ICD-10-CM | POA: Diagnosis not present

## 2024-09-14 DIAGNOSIS — Z3480 Encounter for supervision of other normal pregnancy, unspecified trimester: Secondary | ICD-10-CM

## 2024-09-14 NOTE — Progress Notes (Signed)
 "  Patient information  Patient Name: Charlene Mora  Patient MRN:   [id number]  Referring practice: MFM Referring Provider: [facility]  Problem List   Patient Active Problem List   Diagnosis Date Noted   Polyhydramnios affecting pregnancy 09/14/2024   Essential hypertension during pregnancy 08/29/2024   Maternal morbid obesity in third trimester, antepartum (HCC) 08/29/2024   Genital herpes simplex 08/29/2024   Previous cesarean delivery, antepartum 08/29/2024   Supervision of high risk pregnancy, antepartum 08/08/2024   Maternal Fetal medicine Consult  Charlene Mora is a 28 y.o. H4E7976 at [redacted]w[redacted]d here for ultrasound and consultation. Charlene Mora is doing well today with no acute concerns. Today we focused on the following:   The pregnancy complicated by class III obesity (BMI 48), chronic hypertension, two prior cesarean deliveries, new mild polyhydramnios, and history of HSV infection. Blood pressures are currently well controlled on nifedipine  extended-release (Procardia ) 90 mg daily. Glucose testing was normal, making diabetes an unlikely contributor to the polyhydramnios. Todays ultrasound demonstrates mild polyhydramnios without structural abnormalities and appropriate fetal growth at the 28th percentile, which is reassuring. There is no sonographic evidence of fetal anomaly or growth restriction. Given the combination of chronic hypertension, severe maternal obesity, and prior cesarean deliveries, the patient remains at increased risk for hypertensive complications, stillbirth, surgical morbidity, and anesthetic complications. The mild polyhydramnios appears isolated and idiopathic at this time. The patient states that she desires a repeat cesarean delivery, which is reasonable given her history of two prior cesarean deliveries. I have arranged weekly antenatal testing and growth in 4 weeks if she is still pregnant.  In the prenatal notes there was concern for  succenturiate placental lobe.  That was not seen on today's ultrasound the placenta appears normal.  Standard OB precautions were given to the patient. The patient had time to ask questions that were answered to her satisfaction.  She verbalized understanding and agrees to proceed with the plan above.   Recommendation -Continue nifedipine  extended-release 90 mg daily with ongoing blood pressure monitoring. -Continue low-dose aspirin if still within the indicated gestational window. -Initiate or continue antenatal testing (weekly NST or BPP) given chronic hypertension and BMI >=40. -Repeat amniotic fluid assessment with antenatal testing; no intervention indicated for isolated mild polyhydramnios at this time. -Repeat fetal growth ultrasound in 3-4 weeks to reassess growth and amniotic fluid. -Plan for HSV suppressive therapy beginning at [redacted] weeks gestation. -Plan for scheduled repeat cesarean delivery given two prior cesarean deliveries and patient preference. -Delivery timing to be guided by blood pressure control and antenatal testing, with consideration for scheduled repeat cesarean delivery at 37-39 weeks if hypertension remains well controlled. -Seek OB care sooner for worsening polyhydramnios, abnormal antenatal testing, elevated blood pressures, or symptoms concerning for preeclampsia.  I spent 30 minutes reviewing the patients chart, including labs and images as well as counseling the patient about her medical conditions. Greater than 50% of the time was spent in direct face-to-face patient counseling.  Charlene Mora  MFM, [place]   09/14/2024  1:06 PM   Review of Systems: A review of systems was performed and was negative except per HPI   Vitals and Physical Exam    09/14/2024   11:05 AM 09/12/2024    2:56 PM 09/05/2024    2:37 PM  Vitals with BMI  Weight  310 lbs 10 oz 312 lbs 13 oz  BMI  48.64 48.98  Systolic 142 141 858  Diastolic 67 72 71  Pulse 86 83  86    Sitting comfortably on the sonogram table Nonlabored breathing Normal rate and rhythm Abdomen is nontender  Past pregnancies OB History  Gravida Para Term Preterm AB Living  5 2 2  0 2 3  SAB IAB Ectopic Multiple Live Births  0 1 1 1 3     # Outcome Date GA Lbr Len/2nd Weight Sex Type Anes PTL Lv  5 Current           4 Term 05/07/21 [redacted]w[redacted]d  7 lb 2.3 oz (3.24 kg) M CS-LTranv Spinal  LIV  3 Ectopic           2 IAB           1 Term      CS-Unspec        Future Appointments  Date Time Provider Department Center  09/17/2024  3:15 PM Tad Arland POUR, CNM [facility] [address]  09/17/2024  4:00 PM [facility]BGYN NST [facility] [address]  09/25/2024  9:45 AM [facility] NST [facility] [address]  09/25/2024 10:55 AM Lo, Arland POUR, CNM [facility] [address]  10/15/2024  2:15 PM [facility] PROVIDER 1 [facility]  10/15/2024  2:30 PM [facility] US3 [facility]      "

## 2024-09-17 ENCOUNTER — Other Ambulatory Visit (HOSPITAL_BASED_OUTPATIENT_CLINIC_OR_DEPARTMENT_OTHER): Payer: Self-pay

## 2024-09-17 ENCOUNTER — Encounter (HOSPITAL_BASED_OUTPATIENT_CLINIC_OR_DEPARTMENT_OTHER): Payer: Self-pay | Admitting: Certified Nurse Midwife

## 2024-09-25 ENCOUNTER — Encounter (HOSPITAL_BASED_OUTPATIENT_CLINIC_OR_DEPARTMENT_OTHER): Payer: Self-pay

## 2024-09-25 ENCOUNTER — Other Ambulatory Visit (HOSPITAL_BASED_OUTPATIENT_CLINIC_OR_DEPARTMENT_OTHER): Payer: Self-pay

## 2024-09-25 ENCOUNTER — Encounter (HOSPITAL_BASED_OUTPATIENT_CLINIC_OR_DEPARTMENT_OTHER): Payer: Self-pay | Admitting: Certified Nurse Midwife

## 2024-09-25 ENCOUNTER — Ambulatory Visit (HOSPITAL_BASED_OUTPATIENT_CLINIC_OR_DEPARTMENT_OTHER): Payer: Self-pay | Admitting: Certified Nurse Midwife

## 2024-09-25 VITALS — BP 157/94 | HR 110 | Wt 312.6 lb

## 2024-09-25 DIAGNOSIS — O409XX0 Polyhydramnios, unspecified trimester, not applicable or unspecified: Secondary | ICD-10-CM

## 2024-09-25 DIAGNOSIS — Z3A35 35 weeks gestation of pregnancy: Secondary | ICD-10-CM

## 2024-09-25 DIAGNOSIS — O09893 Supervision of other high risk pregnancies, third trimester: Secondary | ICD-10-CM

## 2024-09-25 DIAGNOSIS — O98313 Other infections with a predominantly sexual mode of transmission complicating pregnancy, third trimester: Secondary | ICD-10-CM

## 2024-09-25 DIAGNOSIS — A6000 Herpesviral infection of urogenital system, unspecified: Secondary | ICD-10-CM

## 2024-09-25 DIAGNOSIS — O403XX0 Polyhydramnios, third trimester, not applicable or unspecified: Secondary | ICD-10-CM

## 2024-09-25 DIAGNOSIS — O34211 Maternal care for low transverse scar from previous cesarean delivery: Secondary | ICD-10-CM

## 2024-09-25 DIAGNOSIS — O34219 Maternal care for unspecified type scar from previous cesarean delivery: Secondary | ICD-10-CM

## 2024-09-25 DIAGNOSIS — O10019 Pre-existing essential hypertension complicating pregnancy, unspecified trimester: Secondary | ICD-10-CM

## 2024-09-25 DIAGNOSIS — O0990 Supervision of high risk pregnancy, unspecified, unspecified trimester: Secondary | ICD-10-CM

## 2024-09-25 DIAGNOSIS — Z3480 Encounter for supervision of other normal pregnancy, unspecified trimester: Secondary | ICD-10-CM

## 2024-09-25 DIAGNOSIS — O99213 Obesity complicating pregnancy, third trimester: Secondary | ICD-10-CM

## 2024-09-25 DIAGNOSIS — O10013 Pre-existing essential hypertension complicating pregnancy, third trimester: Secondary | ICD-10-CM

## 2024-09-25 MED ORDER — NIFEDIPINE ER OSMOTIC RELEASE 60 MG PO TB24
60.0000 mg | ORAL_TABLET | Freq: Every day | ORAL | 1 refills | Status: DC
  Filled 2024-09-25: qty 30, 30d supply, fill #0

## 2024-09-25 MED ORDER — VALACYCLOVIR HCL 500 MG PO TABS: 500.0000 mg | ORAL_TABLET | Freq: Two times a day (BID) | ORAL | 1 refills | Status: DC

## 2024-09-25 NOTE — Progress Notes (Signed)
 "   PRENATAL VISIT NOTE  Subjective:  Charlene Mora is a 28 y.o. H4E7976 at [redacted]w[redacted]d being seen today for ongoing prenatal care.  She is currently monitored for the following issues for this high-risk pregnancy and has Supervision of high risk pregnancy, antepartum; Essential hypertension during pregnancy; Maternal morbid obesity in third trimester, antepartum (HCC); Genital herpes simplex; Previous cesarean delivery, antepartum; and Polyhydramnios affecting pregnancy on their problem list.  Patient reports active fetal movement. She denies headache, RUQ or epigastric pain, nausea/vomiting. Pt desires repeat LTCS. States she takes her Procardia  in the evening. Agreeable to starting Valtrex BID.SABRA  Contractions: Irregular. Vag. Bleeding: None.  Movement: Present. Denies leaking of fluid.   The following portions of the patient's history were reviewed and updated as appropriate: allergies, current medications, past family history, past medical history, past social history, past surgical history and problem list.   Objective:   Vitals:   09/25/24 1013  BP: (!) 157/94  Pulse: (!) 110  Weight: (!) 312 lb 9.6 oz (141.8 kg)    Fetal Status:  Fetal Heart Rate (bpm): 147   Movement: Present    General: Alert, oriented and cooperative. Patient is in no acute distress.  Skin: Skin is warm and dry. No rash noted.   Cardiovascular: Normal heart rate noted  Respiratory: Normal respiratory effort, no problems with respiration noted  Abdomen: Soft, gravid, appropriate for gestational age.  Pain/Pressure: Present     Pelvic: Cervical exam deferred        Extremities: Normal range of motion.  Edema: Trace  Mental Status: Normal mood and affect. Normal behavior. Normal judgment and thought content.       No data to display               No data to display          Assessment and Plan:  Pregnancy: H4E7976 at [redacted]w[redacted]d  1. Supervision of other High-Risk pregnancy, antepartum (Primary) -  CBC - Comp Met (CMET) - Protein / creatinine ratio, urine  2. [redacted] weeks gestation of pregnancy - Discussed GBS and GC/CT at 36 week ROB visit  3. Essential hypertension during pregnancy - Pt states she is taking Procardia  as directed in evenings - Plan twice weekly NST - NST Reactive today. Repeat NST in 3 days (Friday)  4. Maternal morbid obesity in third trimester, antepartum (HCC) - Total weight gain in pregnancy 5lb  5. Polyhydramnios affecting pregnancy - Follow-up US  scheduled  6. Previous cesarean delivery, antepartum - Pt desires repeat LTCS - Does not desire permanent female sterilization  7. Supervision of high risk pregnancy, antepartum  8. Genital herpes simplex, unspecified site - Pt planning repeat LTCS but encouraged to start Valtrex 500mg  po BID at this time.  - valACYclovir (VALTREX) 500 MG tablet; Take 1 tablet (500 mg total) by mouth 2 (two) times daily.  Dispense: 60 tablet; Refill: 1  Preterm labor symptoms and general obstetric precautions including but not limited to vaginal bleeding, contractions, leaking of fluid and fetal movement were reviewed in detail with the patient. Please refer to After Visit Summary for other counseling recommendations.   Return for Pt needs twice weekly NST. Needs NST this Friday. .  Future Appointments  Date Time Provider Department Center  09/28/2024  9:10 AM [facility] NST [facility] [address]  10/01/2024  8:55 AM Cleotilde Ronal RAMAN, MD [facility] [address]  10/01/2024  9:45 AM [facility] NST [facility] [address]  10/04/2024  8:40 AM [facility]  NST [facility] [address]  10/15/2024  2:15 PM [facility] PROVIDER 1 [facility]  10/15/2024  2:30 PM [facility] US3 [facility]    Arland MARLA Roller, CNM  "

## 2024-09-26 LAB — COMPREHENSIVE METABOLIC PANEL WITH GFR
ALT: 5 IU/L (ref 0–32)
AST: 9 IU/L (ref 0–40)
Albumin: 3.7 g/dL — ABNORMAL LOW (ref 4.0–5.0)
Alkaline Phosphatase: 83 IU/L (ref 41–116)
BUN/Creatinine Ratio: 12 (ref 9–23)
BUN: 6 mg/dL (ref 6–20)
Bilirubin Total: <0.2 mg/dL (ref 0.0–1.2)
CO2: 19 mmol/L — ABNORMAL LOW (ref 20–29)
Calcium: 8.9 mg/dL (ref 8.7–10.2)
Chloride: 103 mmol/L (ref 96–106)
Creatinine, Ser: 0.5 mg/dL — ABNORMAL LOW (ref 0.57–1.00)
Globulin, Total: 2.6 g/dL (ref 1.5–4.5)
Glucose: 93 mg/dL (ref 70–99)
Potassium: 4.2 mmol/L (ref 3.5–5.2)
Sodium: 136 mmol/L (ref 134–144)
Total Protein: 6.3 g/dL (ref 6.0–8.5)
eGFR: 129 mL/min/1.73

## 2024-09-26 LAB — CBC
Hematocrit: 32.7 % — ABNORMAL LOW (ref 34.0–46.6)
Hemoglobin: 10.2 g/dL — ABNORMAL LOW (ref 11.1–15.9)
MCH: 25.2 pg — ABNORMAL LOW (ref 26.6–33.0)
MCHC: 31.2 g/dL — ABNORMAL LOW (ref 31.5–35.7)
MCV: 81 fL (ref 79–97)
Platelets: 218 x10E3/uL (ref 150–450)
RBC: 4.05 x10E6/uL (ref 3.77–5.28)
RDW: 14.3 % (ref 11.7–15.4)
WBC: 14 x10E3/uL — ABNORMAL HIGH (ref 3.4–10.8)

## 2024-09-26 LAB — PROTEIN / CREATININE RATIO, URINE
Creatinine, Urine: 167.4 mg/dL
Protein, Ur: 39.8 mg/dL
Protein/Creat Ratio: 238 mg/g{creat} — ABNORMAL HIGH (ref 0–200)

## 2024-09-28 ENCOUNTER — Ambulatory Visit (HOSPITAL_BASED_OUTPATIENT_CLINIC_OR_DEPARTMENT_OTHER): Admitting: Obstetrics & Gynecology

## 2024-09-28 ENCOUNTER — Other Ambulatory Visit (HOSPITAL_COMMUNITY)
Admission: RE | Admit: 2024-09-28 | Discharge: 2024-09-28 | Disposition: A | Discharge status: Home / Self Care | Source: Ambulatory Visit | Attending: Obstetrics & Gynecology | Admitting: Obstetrics & Gynecology

## 2024-09-28 ENCOUNTER — Encounter (HOSPITAL_BASED_OUTPATIENT_CLINIC_OR_DEPARTMENT_OTHER): Payer: Self-pay

## 2024-09-28 ENCOUNTER — Ambulatory Visit (HOSPITAL_BASED_OUTPATIENT_CLINIC_OR_DEPARTMENT_OTHER)

## 2024-09-28 VITALS — BP 151/79 | HR 87 | Wt 314.8 lb

## 2024-09-28 VITALS — BP 140/88 | HR 87 | Wt 314.8 lb

## 2024-09-28 DIAGNOSIS — O34219 Maternal care for unspecified type scar from previous cesarean delivery: Secondary | ICD-10-CM | POA: Diagnosis not present

## 2024-09-28 DIAGNOSIS — O403XX0 Polyhydramnios, third trimester, not applicable or unspecified: Secondary | ICD-10-CM

## 2024-09-28 DIAGNOSIS — O409XX0 Polyhydramnios, unspecified trimester, not applicable or unspecified: Secondary | ICD-10-CM

## 2024-09-28 DIAGNOSIS — O10013 Pre-existing essential hypertension complicating pregnancy, third trimester: Secondary | ICD-10-CM | POA: Diagnosis not present

## 2024-09-28 DIAGNOSIS — Z3483 Encounter for supervision of other normal pregnancy, third trimester: Secondary | ICD-10-CM | POA: Insufficient documentation

## 2024-09-28 DIAGNOSIS — Z3A36 36 weeks gestation of pregnancy: Secondary | ICD-10-CM | POA: Insufficient documentation

## 2024-09-28 DIAGNOSIS — O0990 Supervision of high risk pregnancy, unspecified, unspecified trimester: Secondary | ICD-10-CM

## 2024-09-28 DIAGNOSIS — O2243 Hemorrhoids in pregnancy, third trimester: Secondary | ICD-10-CM | POA: Diagnosis not present

## 2024-09-28 DIAGNOSIS — O98313 Other infections with a predominantly sexual mode of transmission complicating pregnancy, third trimester: Secondary | ICD-10-CM

## 2024-09-28 DIAGNOSIS — O10019 Pre-existing essential hypertension complicating pregnancy, unspecified trimester: Secondary | ICD-10-CM

## 2024-09-28 DIAGNOSIS — O99213 Obesity complicating pregnancy, third trimester: Secondary | ICD-10-CM | POA: Diagnosis not present

## 2024-09-28 DIAGNOSIS — A6004 Herpesviral vulvovaginitis: Secondary | ICD-10-CM

## 2024-09-28 DIAGNOSIS — K649 Unspecified hemorrhoids: Secondary | ICD-10-CM

## 2024-09-28 MED ORDER — LABETALOL HCL 100 MG PO TABS: 100.0000 mg | ORAL_TABLET | Freq: Two times a day (BID) | ORAL | 2 refills | Status: DC

## 2024-09-28 MED ORDER — HYDROCORTISONE (PERIANAL) 2.5 % EX CREA: TOPICAL_CREAM | Freq: Four times a day (QID) | CUTANEOUS | 1 refills | Status: AC

## 2024-09-28 NOTE — Progress Notes (Signed)
 "  PRENATAL VISIT NOTE  Subjective:  Charlene Mora is a 29 y.o. H4E7976 at [redacted]w[redacted]d being seen today for ongoing prenatal care.  She is currently monitored for the following issues for this high-risk pregnancy and has Supervision of high risk pregnancy, antepartum; Essential hypertension during pregnancy; Maternal morbid obesity in third trimester, antepartum (HCC); Genital herpes simplex; Previous cesarean delivery, antepartum; and Polyhydramnios affecting pregnancy on their problem list.  Patient reports no headache, no visual changes, no SOB.  Had prot/creatinine ratio earlier this week.on 12/30.  Protein/creatinin was 238.  Platelets and liver enzymes normal  .  Contractions: Not present.  .  Movement: Present. Denies leaking of fluid.   The following portions of the patient's history were reviewed and updated as appropriate: allergies, current medications, past family history, past medical history, past social history, past surgical history and problem list.   Objective:   Vitals:   09/28/24 1336  BP: (!) 140/88  Pulse: 87  Weight: (!) 314 lb 12.8 oz (142.8 kg)    Fetal Status:  Fetal Heart Rate (bpm): 132 Fundal Height: 37 cm Movement: Present    General: Alert, oriented and cooperative. Patient is in no acute distress.  Skin: Skin is warm and dry. No rash noted.   Cardiovascular: Normal heart rate noted  Respiratory: Normal respiratory effort, no problems with respiration noted  Abdomen: Soft, gravid, appropriate for gestational age.  Pain/Pressure: Present     Pelvic: Cervical exam deferred      GC/Chl and GBS obtained  Extremities: Normal range of motion.  Edema: None  Mental Status: Normal mood and affect. Normal behavior. Normal judgment and thought content.       No data to display               No data to display          Assessment and Plan:  Pregnancy: H4E7976 at [redacted]w[redacted]d 1. Supervision of high risk pregnancy, antepartum (Primary) - on PNV - not taking  baby ASA  2. [redacted] weeks gestation of pregnancy - Cervicovaginal ancillary only( [place]) - Culture, beta strep (group b only)  3. Essential hypertension during pregnancy - on procadia XL 60mg  daily.  Feels a little funny when she takes it to is taking at night.   - will add labetolol 100mg  BID - NST today reactive - EGW 2376gms (28%til on 09/14/2024) - Repeat c section scheduled for 10/15/2024 at 37 6/7.  She does not want a tubal ligation but interested in Nexplanon at post partum appt.  4. Hemorrhoids, unspecified hemorrhoid type - hydrocortisone (ANUSOL-HC) 2.5 % rectal cream; Place rectally 4 (four) times daily.  Dispense: 30 g; Refill: 1  5. Polyhydramnios affecting pregnancy - AFI 27.14 on 09/14/2024  6. Previous cesarean delivery, antepartum - order for surg scheduling sent today.  As she is close to her due date, called and verbally scheduled c section for 10/12/2024.  7. Herpes simplex vulvovaginitis - pt does have rx for valtrex but has not started.  Discussed and she states she will get rx filled today  8. Maternal morbid obesity in third trimester, antepartum (HCC)  Preterm labor symptoms and general obstetric precautions including but not limited to vaginal bleeding, contractions, leaking of fluid and fetal movement were reviewed in detail with the patient. Please refer to After Visit Summary for other counseling recommendations.   Return in 3 days (on 10/01/2024) for NST.  Future Appointments  Date Time Provider Department Center  10/01/2024  8:55 AM  Cleotilde Ronal RAMAN, MD [facility] [id number]  10/01/2024  9:45 AM [facility] NST [facility] [id number]  10/04/2024  8:40 AM [facility] NST [facility] [address]  10/16/2024  3:15 PM [facility] PROVIDER 1 [facility]  10/16/2024  3:30 PM [facility] US2 [facility]    Ronal RAMAN Cleotilde, MD  "

## 2024-10-01 ENCOUNTER — Encounter (HOSPITAL_COMMUNITY): Payer: Self-pay

## 2024-10-01 ENCOUNTER — Telehealth (HOSPITAL_COMMUNITY): Payer: Self-pay | Admitting: *Deleted

## 2024-10-01 ENCOUNTER — Other Ambulatory Visit (HOSPITAL_BASED_OUTPATIENT_CLINIC_OR_DEPARTMENT_OTHER): Payer: Self-pay

## 2024-10-01 ENCOUNTER — Encounter (HOSPITAL_BASED_OUTPATIENT_CLINIC_OR_DEPARTMENT_OTHER): Admitting: Obstetrics & Gynecology

## 2024-10-01 DIAGNOSIS — O99013 Anemia complicating pregnancy, third trimester: Secondary | ICD-10-CM

## 2024-10-01 DIAGNOSIS — O10019 Pre-existing essential hypertension complicating pregnancy, unspecified trimester: Secondary | ICD-10-CM

## 2024-10-01 LAB — CERVICOVAGINAL ANCILLARY ONLY
Chlamydia: NEGATIVE
Comment: NEGATIVE
Comment: NORMAL
Neisseria Gonorrhea: NEGATIVE

## 2024-10-01 MED ORDER — FERROUS SULFATE 325 (65 FE) MG PO TABS: 325.0000 mg | ORAL_TABLET | Freq: Every day | ORAL | 3 refills | Status: AC

## 2024-10-01 NOTE — Patient Instructions (Addendum)
"   Yoshi Vicencio  10/01/2024   Your procedure is scheduled on:  10/12/2024  Arrive at 0800 at Entrance C on [address] at [facility]  and [address]. You are invited to use the FREE valet parking or use the Visitor's parking deck.  Pick up the phone at the desk and dial [phone].  Call this number if you have problems the morning of surgery: [phone]  Remember:   Do not eat food:(After Midnight) Desps de medianoche.  You may drink clear liquids until  __0600___.  Clear liquids means a liquid you can see thru.  It can have color such as Cola or Kool aid.  Tea is OK and coffee as long as no milk or creamer of any kind.  Take these medicines the morning of surgery with A SIP OF WATER:  Labetalol  as prescribed   Do not wear jewelry, make-up or nail polish.  Do not wear lotions, powders, or perfumes. Do not wear deodorant.  Do not shave 48 hours prior to surgery.  Do not bring valuables to the hospital.  [facility] is not   responsible for any belongings or valuables brought to the hospital.  Contacts, dentures or bridgework may not be worn into surgery.  Leave suitcase in the car. After surgery it may be brought to your room.  For patients admitted to the hospital, checkout time is 11:00 AM the day of              discharge.      Please read over the following fact sheets that you were given:     Preparing for Surgery   "

## 2024-10-01 NOTE — Telephone Encounter (Signed)
 Preadmission screen

## 2024-10-02 LAB — CULTURE, BETA STREP (GROUP B ONLY): Strep Gp B Culture: NEGATIVE

## 2024-10-03 ENCOUNTER — Encounter (HOSPITAL_BASED_OUTPATIENT_CLINIC_OR_DEPARTMENT_OTHER): Payer: Self-pay

## 2024-10-04 ENCOUNTER — Encounter (HOSPITAL_BASED_OUTPATIENT_CLINIC_OR_DEPARTMENT_OTHER): Payer: Self-pay

## 2024-10-04 ENCOUNTER — Other Ambulatory Visit (HOSPITAL_BASED_OUTPATIENT_CLINIC_OR_DEPARTMENT_OTHER): Payer: Self-pay

## 2024-10-04 ENCOUNTER — Ambulatory Visit (INDEPENDENT_AMBULATORY_CARE_PROVIDER_SITE_OTHER): Payer: Self-pay | Admitting: Obstetrics and Gynecology

## 2024-10-04 VITALS — BP 139/76 | HR 93 | Wt 318.2 lb

## 2024-10-04 DIAGNOSIS — O10013 Pre-existing essential hypertension complicating pregnancy, third trimester: Secondary | ICD-10-CM | POA: Diagnosis not present

## 2024-10-04 DIAGNOSIS — B009 Herpesviral infection, unspecified: Secondary | ICD-10-CM | POA: Diagnosis not present

## 2024-10-04 DIAGNOSIS — O98513 Other viral diseases complicating pregnancy, third trimester: Secondary | ICD-10-CM

## 2024-10-04 DIAGNOSIS — A6000 Herpesviral infection of urogenital system, unspecified: Secondary | ICD-10-CM

## 2024-10-04 DIAGNOSIS — Z3A36 36 weeks gestation of pregnancy: Secondary | ICD-10-CM

## 2024-10-04 DIAGNOSIS — O10019 Pre-existing essential hypertension complicating pregnancy, unspecified trimester: Secondary | ICD-10-CM

## 2024-10-04 DIAGNOSIS — O403XX0 Polyhydramnios, third trimester, not applicable or unspecified: Secondary | ICD-10-CM

## 2024-10-04 DIAGNOSIS — O34219 Maternal care for unspecified type scar from previous cesarean delivery: Secondary | ICD-10-CM | POA: Diagnosis not present

## 2024-10-04 DIAGNOSIS — O0990 Supervision of high risk pregnancy, unspecified, unspecified trimester: Secondary | ICD-10-CM

## 2024-10-04 DIAGNOSIS — O409XX0 Polyhydramnios, unspecified trimester, not applicable or unspecified: Secondary | ICD-10-CM

## 2024-10-04 NOTE — Progress Notes (Signed)
" ° °  PRENATAL VISIT NOTE  Subjective:  Charlene Mora is a 29 y.o. H4E7976 at [redacted]w[redacted]d being seen today for ongoing prenatal care.  She is currently monitored for the following issues for this high-risk pregnancy and has Supervision of high risk pregnancy, antepartum; Essential hypertension during pregnancy; Maternal morbid obesity in third trimester, antepartum (HCC); Genital herpes simplex; Previous cesarean delivery, antepartum; and Polyhydramnios affecting pregnancy on their problem list.  Patient reports no complaints.  Contractions: Irregular. Vag. Bleeding: None.  Movement: Present. Denies leaking of fluid.   The following portions of the patient's history were reviewed and updated as appropriate: allergies, current medications, past family history, past medical history, past social history, past surgical history and problem list.   Objective:   Vitals:   10/04/24 0856 10/04/24 0936  BP: (!) 157/76 139/76  Pulse: 79 93  Weight: (!) 318 lb 3.2 oz (144.3 kg)     Fetal Status:  Fetal Heart Rate (bpm): 133   Movement: Present Presentation: Vertex (bedside u/s)  General: Alert, oriented and cooperative. Patient is in no acute distress.  Skin: Skin is warm and dry. No rash noted.   Cardiovascular: Normal heart rate noted  Respiratory: Normal respiratory effort, no problems with respiration noted  Abdomen: Soft, gravid, appropriate for gestational age.  Pain/Pressure: Present     Pelvic: Cervical exam deferred        Extremities: Normal range of motion.  Edema: None  Mental Status: Normal mood and affect. Normal behavior. Normal judgment and thought content.   Assessment and Plan:  Pregnancy: H4E7976 at [redacted]w[redacted]d 1. Supervision of high risk pregnancy, antepartum (Primary)   2. [redacted] weeks gestation of pregnancy Labor precautions   3. Previous cesarean delivery, antepartum RCS 1/16  4. Essential hypertension during pregnancy She is taking Labetalol  100mg  BID, but has not been taking  procardia  60mg , encouraged to restart this and take this in combination with labetalol   Repeat BP today normal No PEC s&s, precautions given when to be evaluated for symptoms NST today reactive   5. Polyhydramnios affecting pregnancy 12/26 afi 27.14 EFW 28% Return next week for NST   6. Genital herpes simplex, unspecified site Did not receive valtrex  from pharmacy, encouraged to pick up medication     Preterm labor symptoms and general obstetric precautions including but not limited to vaginal bleeding, contractions, leaking of fluid and fetal movement were reviewed in detail with the patient. Please refer to After Visit Summary for other counseling recommendations.   Return one week ob visit and NST.  Future Appointments  Date Time Provider Department Center  10/10/2024 10:00 AM [facility] PAT 1 [facility] None  10/11/2024  3:55 PM Delores Nidia CROME, FNP [facility] [id number]  10/16/2024  3:15 PM [facility] PROVIDER 1 [facility]  10/16/2024  3:30 PM [facility] US2 [facility]      Nidia Delores, FNP "

## 2024-10-06 NOTE — Anesthesia Preprocedure Evaluation (Addendum)
"                                    Anesthesia Evaluation  Patient identified by MRN, date of birth, ID band Patient awake    Reviewed: Allergy & Precautions, H&P , NPO status , Patient's Chart, lab work & pertinent test results, reviewed documented beta blocker date and time   Airway Mallampati: III  TM Distance: >3 FB Neck ROM: Full    Dental  (+) Teeth Intact, Dental Advisory Given   Pulmonary neg pulmonary ROS   Pulmonary exam normal breath sounds clear to auscultation       Cardiovascular hypertension (gHTN, 140/93 preop), Pt. on medications and Pt. on home beta blockers Normal cardiovascular exam Rhythm:Regular Rate:Normal     Neuro/Psych negative neurological ROS  negative psych ROS   GI/Hepatic negative GI ROS, Neg liver ROS,,,  Endo/Other    Class 4 obesity (BMI 50)  Renal/GU negative Renal ROS  negative genitourinary   Musculoskeletal negative musculoskeletal ROS (+)    Abdominal  (+) + obese  Peds negative pediatric ROS (+)  Hematology  (+) Blood dyscrasia, anemia Hb 9.9, plt 267   Anesthesia Other Findings   Reproductive/Obstetrics (+) Pregnancy 2 prior sections                              Anesthesia Physical Anesthesia Plan  ASA: 3  Anesthesia Plan: Spinal   Post-op Pain Management: Regional block, Ofirmev  IV (intra-op)* and Toradol  IV (intra-op)*   Induction:   PONV Risk Score and Plan: 3 and Ondansetron , Dexamethasone  and Treatment may vary due to age or medical condition  Airway Management Planned: Natural Airway and Nasal Cannula  Additional Equipment: None  Intra-op Plan:   Post-operative Plan:   Informed Consent: I have reviewed the patients History and Physical, chart, labs and discussed the procedure including the risks, benefits and alternatives for the proposed anesthesia with the patient or authorized representative who has indicated his/her understanding and acceptance.        Plan Discussed with: CRNA  Anesthesia Plan Comments:          Anesthesia Quick Evaluation  "

## 2024-10-10 ENCOUNTER — Encounter (HOSPITAL_COMMUNITY)
Admission: RE | Admit: 2024-10-10 | Discharge: 2024-10-10 | Disposition: A | Discharge status: Home / Self Care | Source: Ambulatory Visit | Attending: Family Medicine | Admitting: Family Medicine

## 2024-10-10 DIAGNOSIS — O34219 Maternal care for unspecified type scar from previous cesarean delivery: Secondary | ICD-10-CM | POA: Insufficient documentation

## 2024-10-10 LAB — SYPHILIS: RPR W/REFLEX TO RPR TITER AND TREPONEMAL ANTIBODIES, TRADITIONAL SCREENING AND DIAGNOSIS ALGORITHM: RPR Ser Ql: NONREACTIVE

## 2024-10-10 LAB — CBC
HCT: 32.1 % — ABNORMAL LOW (ref 36.0–46.0)
Hemoglobin: 9.9 g/dL — ABNORMAL LOW (ref 12.0–15.0)
MCH: 24.5 pg — ABNORMAL LOW (ref 26.0–34.0)
MCHC: 30.8 g/dL (ref 30.0–36.0)
MCV: 79.5 fL — ABNORMAL LOW (ref 80.0–100.0)
Platelets: 267 K/uL (ref 150–400)
RBC: 4.04 MIL/uL (ref 3.87–5.11)
RDW: 15.9 % — ABNORMAL HIGH (ref 11.5–15.5)
WBC: 14.7 K/uL — ABNORMAL HIGH (ref 4.0–10.5)
nRBC: 0 % (ref 0.0–0.2)

## 2024-10-10 LAB — TYPE AND SCREEN
ABO/RH(D): B POS
Antibody Screen: NEGATIVE

## 2024-10-11 ENCOUNTER — Ambulatory Visit (INDEPENDENT_AMBULATORY_CARE_PROVIDER_SITE_OTHER): Payer: Self-pay | Admitting: Obstetrics and Gynecology

## 2024-10-11 VITALS — BP 144/80 | HR 82 | Wt 318.0 lb

## 2024-10-11 DIAGNOSIS — O10013 Pre-existing essential hypertension complicating pregnancy, third trimester: Secondary | ICD-10-CM

## 2024-10-11 DIAGNOSIS — O09893 Supervision of other high risk pregnancies, third trimester: Secondary | ICD-10-CM | POA: Diagnosis not present

## 2024-10-11 DIAGNOSIS — O34219 Maternal care for unspecified type scar from previous cesarean delivery: Secondary | ICD-10-CM | POA: Diagnosis not present

## 2024-10-11 DIAGNOSIS — Z3A37 37 weeks gestation of pregnancy: Secondary | ICD-10-CM | POA: Diagnosis not present

## 2024-10-11 DIAGNOSIS — O99213 Obesity complicating pregnancy, third trimester: Secondary | ICD-10-CM | POA: Diagnosis not present

## 2024-10-11 DIAGNOSIS — O403XX0 Polyhydramnios, third trimester, not applicable or unspecified: Secondary | ICD-10-CM | POA: Diagnosis not present

## 2024-10-11 DIAGNOSIS — A6000 Herpesviral infection of urogenital system, unspecified: Secondary | ICD-10-CM

## 2024-10-11 DIAGNOSIS — O98313 Other infections with a predominantly sexual mode of transmission complicating pregnancy, third trimester: Secondary | ICD-10-CM | POA: Diagnosis not present

## 2024-10-11 DIAGNOSIS — O10019 Pre-existing essential hypertension complicating pregnancy, unspecified trimester: Secondary | ICD-10-CM

## 2024-10-11 DIAGNOSIS — O409XX0 Polyhydramnios, unspecified trimester, not applicable or unspecified: Secondary | ICD-10-CM

## 2024-10-11 DIAGNOSIS — O0990 Supervision of high risk pregnancy, unspecified, unspecified trimester: Secondary | ICD-10-CM

## 2024-10-11 MED ORDER — NIFEDIPINE ER OSMOTIC RELEASE 90 MG PO TB24: 90.0000 mg | ORAL_TABLET | Freq: Every day | ORAL | 4 refills | Status: DC

## 2024-10-11 NOTE — Progress Notes (Signed)
" ° °  PRENATAL VISIT NOTE  Subjective:  Charlene Mora is a 29 y.o. [phone] at [redacted]w[redacted]d being seen today for ongoing prenatal care.  She is currently monitored for the following issues for this high-risk pregnancy and has Supervision of high risk pregnancy, antepartum; Essential hypertension during pregnancy; Maternal morbid obesity in third trimester, antepartum (HCC); Genital herpes simplex; Previous cesarean delivery, antepartum; and Polyhydramnios affecting pregnancy on their problem list.  Patient reports no complaints.  Contractions: Not present. Vag. Bleeding: None.  Movement: Present. Denies leaking of fluid.   The following portions of the patient's history were reviewed and updated as appropriate: allergies, current medications, past family history, past medical history, past social history, past surgical history and problem list.   Objective:   Vitals:   10/11/24 1618  BP: (!) 144/80  Pulse: 82  Weight: (!) 318 lb (144.2 kg)    Fetal Status:  Fetal Heart Rate (bpm): 138   Movement: Present    General: Alert, oriented and cooperative. Patient is in no acute distress.  Skin: Skin is warm and dry. No rash noted.   Cardiovascular: Normal heart rate noted  Respiratory: Normal respiratory effort, no problems with respiration noted  Abdomen: Soft, gravid, appropriate for gestational age.  Pain/Pressure: Present     Pelvic: Cervical exam deferred        Extremities: Normal range of motion.  Edema: None  Mental Status: Normal mood and affect. Normal behavior. Normal judgment and thought content.    Assessment and Plan:  Pregnancy: H4E7976 at 101w5d 1. Supervision of high risk pregnancy, antepartum (Primary)   2. Essential hypertension during pregnancy Labetalol  100mg  BID Procardia  60mg  increase to 90mg  No pec s&s NST today C/s tomorrow   3. Genital herpes simplex, unspecified site On valtrex    4. Maternal morbid obesity in third trimester, antepartum (HCC)  5.  Polyhydramnios affecting pregnancy 12/26 afi 27.14 EFW 28%   6. Previous cesarean delivery, antepartum RCS tomorrow     Term labor symptoms and general obstetric precautions including but not limited to vaginal bleeding, contractions, leaking of fluid and fetal movement were reviewed in detail with the patient. Please refer to After Visit Summary for other counseling recommendations.   Return PP  Nidia Daring, FNP "

## 2024-10-12 ENCOUNTER — Inpatient Hospital Stay (HOSPITAL_COMMUNITY): Payer: Self-pay | Admitting: Anesthesiology

## 2024-10-12 ENCOUNTER — Inpatient Hospital Stay (HOSPITAL_COMMUNITY)
Admission: RE | Admit: 2024-10-12 | Discharge: 2024-10-15 | DRG: 787 | Disposition: A | Discharge status: Home / Self Care | Attending: Family Medicine | Admitting: Family Medicine

## 2024-10-12 ENCOUNTER — Encounter (HOSPITAL_BASED_OUTPATIENT_CLINIC_OR_DEPARTMENT_OTHER): Payer: Self-pay

## 2024-10-12 ENCOUNTER — Other Ambulatory Visit: Payer: Self-pay

## 2024-10-12 ENCOUNTER — Encounter (HOSPITAL_COMMUNITY): Payer: Self-pay | Admitting: Family Medicine

## 2024-10-12 ENCOUNTER — Encounter (HOSPITAL_COMMUNITY)
Admission: RE | Disposition: A | Discharge status: Home / Self Care | Payer: Self-pay | Source: Home / Self Care | Attending: Family Medicine

## 2024-10-12 ENCOUNTER — Encounter (HOSPITAL_COMMUNITY): Payer: Self-pay | Admitting: Anesthesiology

## 2024-10-12 DIAGNOSIS — O9832 Other infections with a predominantly sexual mode of transmission complicating childbirth: Secondary | ICD-10-CM | POA: Diagnosis present

## 2024-10-12 DIAGNOSIS — A6000 Herpesviral infection of urogenital system, unspecified: Secondary | ICD-10-CM | POA: Diagnosis present

## 2024-10-12 DIAGNOSIS — Z3A37 37 weeks gestation of pregnancy: Secondary | ICD-10-CM

## 2024-10-12 DIAGNOSIS — O34211 Maternal care for low transverse scar from previous cesarean delivery: Secondary | ICD-10-CM | POA: Diagnosis present

## 2024-10-12 DIAGNOSIS — O403XX0 Polyhydramnios, third trimester, not applicable or unspecified: Secondary | ICD-10-CM | POA: Diagnosis present

## 2024-10-12 DIAGNOSIS — Z8249 Family history of ischemic heart disease and other diseases of the circulatory system: Secondary | ICD-10-CM

## 2024-10-12 DIAGNOSIS — O409XX0 Polyhydramnios, unspecified trimester, not applicable or unspecified: Secondary | ICD-10-CM | POA: Diagnosis present

## 2024-10-12 DIAGNOSIS — O9081 Anemia of the puerperium: Secondary | ICD-10-CM | POA: Diagnosis not present

## 2024-10-12 DIAGNOSIS — O10019 Pre-existing essential hypertension complicating pregnancy, unspecified trimester: Secondary | ICD-10-CM | POA: Diagnosis present

## 2024-10-12 DIAGNOSIS — Z30017 Encounter for initial prescription of implantable subdermal contraceptive: Secondary | ICD-10-CM

## 2024-10-12 DIAGNOSIS — D62 Acute posthemorrhagic anemia: Secondary | ICD-10-CM | POA: Diagnosis not present

## 2024-10-12 DIAGNOSIS — O99214 Obesity complicating childbirth: Secondary | ICD-10-CM | POA: Diagnosis present

## 2024-10-12 DIAGNOSIS — O34219 Maternal care for unspecified type scar from previous cesarean delivery: Principal | ICD-10-CM

## 2024-10-12 DIAGNOSIS — O1002 Pre-existing essential hypertension complicating childbirth: Secondary | ICD-10-CM | POA: Diagnosis present

## 2024-10-12 DIAGNOSIS — Z98891 History of uterine scar from previous surgery: Secondary | ICD-10-CM

## 2024-10-12 DIAGNOSIS — Z975 Presence of (intrauterine) contraceptive device: Secondary | ICD-10-CM

## 2024-10-12 DIAGNOSIS — Z833 Family history of diabetes mellitus: Secondary | ICD-10-CM

## 2024-10-12 DIAGNOSIS — Z79899 Other long term (current) drug therapy: Secondary | ICD-10-CM

## 2024-10-12 LAB — COMPREHENSIVE METABOLIC PANEL WITH GFR
ALT: 11 U/L (ref 0–44)
AST: 14 U/L — ABNORMAL LOW (ref 15–41)
Albumin: 3.5 g/dL (ref 3.5–5.0)
Alkaline Phosphatase: 78 U/L (ref 38–126)
Anion gap: 9 (ref 5–15)
BUN: 5 mg/dL — ABNORMAL LOW (ref 6–20)
CO2: 21 mmol/L — ABNORMAL LOW (ref 22–32)
Calcium: 9.1 mg/dL (ref 8.9–10.3)
Chloride: 99 mmol/L (ref 98–111)
Creatinine, Ser: 0.49 mg/dL (ref 0.44–1.00)
GFR, Estimated: >60 mL/min
Glucose, Bld: 98 mg/dL (ref 70–99)
Potassium: 4 mmol/L (ref 3.5–5.1)
Sodium: 130 mmol/L — ABNORMAL LOW (ref 135–145)
Total Bilirubin: 0.3 mg/dL (ref 0.0–1.2)
Total Protein: 6.8 g/dL (ref 6.5–8.1)

## 2024-10-12 MED ORDER — ONDANSETRON HCL 4 MG/2ML IJ SOLN: 4.0000 mg | Freq: Once | INTRAMUSCULAR | Status: DC | PRN

## 2024-10-12 MED ORDER — HYDROMORPHONE HCL 1 MG/ML IJ SOLN: 0.2000 mg | INTRAMUSCULAR | Status: DC | PRN

## 2024-10-12 MED ORDER — HYDRALAZINE HCL 20 MG/ML IJ SOLN: 10.0000 mg | INTRAMUSCULAR | Status: DC | PRN

## 2024-10-12 MED ORDER — KETOROLAC TROMETHAMINE 30 MG/ML IJ SOLN
INTRAMUSCULAR | Status: DC | PRN
  Administered 2024-10-12: 30 mg via INTRAVENOUS

## 2024-10-12 MED ORDER — WITCH HAZEL-GLYCERIN EX PADS: 1.0000 | MEDICATED_PAD | CUTANEOUS | Status: DC | PRN

## 2024-10-12 MED ORDER — PRENATAL MULTIVITAMIN CH
1.0000 | ORAL_TABLET | Freq: Every day | ORAL | Status: DC
  Administered 2024-10-12 – 2024-10-14 (×3): 1 via ORAL
  Filled 2024-10-12 (×4): qty 1

## 2024-10-12 MED ORDER — MEPERIDINE HCL 25 MG/ML IJ SOLN: 6.2500 mg | INTRAMUSCULAR | Status: DC | PRN

## 2024-10-12 MED ORDER — HYDROMORPHONE HCL 1 MG/ML IJ SOLN: 0.2500 mg | INTRAMUSCULAR | Status: DC | PRN

## 2024-10-12 MED ORDER — LISINOPRIL 5 MG PO TABS
5.0000 mg | ORAL_TABLET | Freq: Every day | ORAL | Status: DC
  Filled 2024-10-12 (×2): qty 1

## 2024-10-12 MED ORDER — KETOROLAC TROMETHAMINE 30 MG/ML IJ SOLN: 30.0000 mg | Freq: Four times a day (QID) | INTRAMUSCULAR | Status: DC | PRN

## 2024-10-12 MED ORDER — DIPHENHYDRAMINE HCL 50 MG/ML IJ SOLN
INTRAMUSCULAR | Status: AC
  Filled 2024-10-12: qty 1

## 2024-10-12 MED ORDER — NALOXONE HCL 4 MG/10ML IJ SOLN: 1.0000 ug/kg/h | INTRAVENOUS | Status: DC | PRN

## 2024-10-12 MED ORDER — MORPHINE SULFATE (PF) 0.5 MG/ML IJ SOLN
INTRAMUSCULAR | Status: DC | PRN
  Administered 2024-10-12: .15 mg via INTRATHECAL

## 2024-10-12 MED ORDER — DIPHENHYDRAMINE HCL 50 MG/ML IJ SOLN
12.5000 mg | INTRAMUSCULAR | Status: DC | PRN
  Administered 2024-10-12: 12.5 mg via INTRAVENOUS

## 2024-10-12 MED ORDER — BUPIVACAINE IN DEXTROSE 0.75-8.25 % IT SOLN
INTRATHECAL | Status: DC | PRN
  Administered 2024-10-12: 1.6 mL via INTRATHECAL

## 2024-10-12 MED ORDER — SENNOSIDES-DOCUSATE SODIUM 8.6-50 MG PO TABS
2.0000 | ORAL_TABLET | Freq: Every day | ORAL | Status: DC
  Administered 2024-10-13 – 2024-10-15 (×3): 2 via ORAL
  Filled 2024-10-12 (×3): qty 2

## 2024-10-12 MED ORDER — ACETAMINOPHEN 10 MG/ML IV SOLN
INTRAVENOUS | Status: AC
  Filled 2024-10-12: qty 100

## 2024-10-12 MED ORDER — NALOXONE HCL 0.4 MG/ML IJ SOLN: 0.4000 mg | INTRAMUSCULAR | Status: DC | PRN

## 2024-10-12 MED ORDER — POVIDONE-IODINE 10 % EX SWAB
2.0000 | Freq: Once | CUTANEOUS | Status: AC
  Administered 2024-10-12: 2 via TOPICAL

## 2024-10-12 MED ORDER — SIMETHICONE 80 MG PO CHEW: 80.0000 mg | CHEWABLE_TABLET | ORAL | Status: DC | PRN

## 2024-10-12 MED ORDER — KETOROLAC TROMETHAMINE 30 MG/ML IJ SOLN
30.0000 mg | Freq: Four times a day (QID) | INTRAMUSCULAR | Status: AC
  Administered 2024-10-12 – 2024-10-13 (×4): 30 mg via INTRAVENOUS
  Filled 2024-10-12 (×4): qty 1

## 2024-10-12 MED ORDER — ACETAMINOPHEN 500 MG PO TABS: 1000.0000 mg | ORAL_TABLET | Freq: Four times a day (QID) | ORAL | Status: DC

## 2024-10-12 MED ORDER — GABAPENTIN 300 MG PO CAPS
300.0000 mg | ORAL_CAPSULE | ORAL | Status: AC
  Administered 2024-10-12: 300 mg via ORAL

## 2024-10-12 MED ORDER — SODIUM CHLORIDE 0.9% FLUSH: 3.0000 mL | INTRAVENOUS | Status: DC | PRN

## 2024-10-12 MED ORDER — SOD CITRATE-CITRIC ACID 500-334 MG/5ML PO SOLN
ORAL | Status: AC
  Filled 2024-10-12: qty 30

## 2024-10-12 MED ORDER — DEXAMETHASONE SOD PHOSPHATE PF 10 MG/ML IJ SOLN
INTRAMUSCULAR | Status: DC | PRN
  Administered 2024-10-12: 10 mg via INTRAVENOUS

## 2024-10-12 MED ORDER — STERILE WATER FOR IRRIGATION IR SOLN
Status: DC | PRN
  Administered 2024-10-12: 1

## 2024-10-12 MED ORDER — POTASSIUM CHLORIDE CRYS ER 20 MEQ PO TBCR
20.0000 meq | EXTENDED_RELEASE_TABLET | Freq: Every day | ORAL | Status: DC
  Administered 2024-10-12 – 2024-10-15 (×4): 20 meq via ORAL
  Filled 2024-10-12 (×4): qty 1

## 2024-10-12 MED ORDER — SODIUM CHLORIDE 0.9 % IV SOLN
INTRAVENOUS | Status: AC
  Filled 2024-10-12: qty 5

## 2024-10-12 MED ORDER — DIBUCAINE (PERIANAL) 1 % EX OINT: 1.0000 | TOPICAL_OINTMENT | CUTANEOUS | Status: DC | PRN

## 2024-10-12 MED ORDER — LABETALOL HCL 5 MG/ML IV SOLN: 20.0000 mg | INTRAVENOUS | Status: DC | PRN

## 2024-10-12 MED ORDER — CEFAZOLIN SODIUM-DEXTROSE 2-4 GM/100ML-% IV SOLN
INTRAVENOUS | Status: AC
  Filled 2024-10-12: qty 100

## 2024-10-12 MED ORDER — MENTHOL 3 MG MT LOZG: 1.0000 | LOZENGE | OROMUCOSAL | Status: DC | PRN

## 2024-10-12 MED ORDER — OXYCODONE HCL 5 MG PO TABS
5.0000 mg | ORAL_TABLET | ORAL | Status: DC | PRN
  Administered 2024-10-14 – 2024-10-15 (×3): 5 mg via ORAL
  Filled 2024-10-12 (×3): qty 1

## 2024-10-12 MED ORDER — ENOXAPARIN SODIUM 80 MG/0.8ML IJ SOSY
0.5000 mg/kg | PREFILLED_SYRINGE | INTRAMUSCULAR | Status: DC
  Administered 2024-10-13 – 2024-10-15 (×3): 72.5 mg via SUBCUTANEOUS
  Filled 2024-10-12 (×3): qty 0.8

## 2024-10-12 MED ORDER — ONDANSETRON HCL 4 MG/2ML IJ SOLN: 4.0000 mg | Freq: Three times a day (TID) | INTRAMUSCULAR | Status: DC | PRN

## 2024-10-12 MED ORDER — FUROSEMIDE 20 MG PO TABS
20.0000 mg | ORAL_TABLET | Freq: Every day | ORAL | Status: DC
  Administered 2024-10-12 – 2024-10-15 (×4): 20 mg via ORAL
  Filled 2024-10-12 (×4): qty 1

## 2024-10-12 MED ORDER — ONDANSETRON HCL 4 MG/2ML IJ SOLN
INTRAMUSCULAR | Status: AC
  Filled 2024-10-12: qty 2

## 2024-10-12 MED ORDER — OXYCODONE HCL 5 MG/5ML PO SOLN: 5.0000 mg | Freq: Once | ORAL | Status: DC | PRN

## 2024-10-12 MED ORDER — COCONUT OIL OIL
1.0000 | TOPICAL_OIL | Status: DC | PRN
  Administered 2024-10-12 – 2024-10-15 (×2): 1 via TOPICAL

## 2024-10-12 MED ORDER — PHENYLEPHRINE HCL (PRESSORS) 10 MG/ML IV SOLN
INTRAVENOUS | Status: DC | PRN
  Administered 2024-10-12 (×2): 80 ug via INTRAVENOUS

## 2024-10-12 MED ORDER — TRANEXAMIC ACID-NACL 1000-0.7 MG/100ML-% IV SOLN
INTRAVENOUS | Status: AC
  Filled 2024-10-12: qty 100

## 2024-10-12 MED ORDER — ALBUMIN HUMAN 5 % IV SOLN
INTRAVENOUS | Status: AC
  Filled 2024-10-12: qty 250

## 2024-10-12 MED ORDER — SOD CITRATE-CITRIC ACID 500-334 MG/5ML PO SOLN
30.0000 mL | ORAL | Status: AC
  Administered 2024-10-12: 30 mL via ORAL

## 2024-10-12 MED ORDER — BUPIVACAINE HCL 0.25 % IJ SOLN
INTRAMUSCULAR | Status: DC | PRN
  Administered 2024-10-12: 30 mL

## 2024-10-12 MED ORDER — LABETALOL HCL 5 MG/ML IV SOLN: 40.0000 mg | INTRAVENOUS | Status: DC | PRN

## 2024-10-12 MED ORDER — DIPHENHYDRAMINE HCL 25 MG PO CAPS: 25.0000 mg | ORAL_CAPSULE | Freq: Four times a day (QID) | ORAL | Status: DC | PRN

## 2024-10-12 MED ORDER — BUPIVACAINE HCL (PF) 0.25 % IJ SOLN
INTRAMUSCULAR | Status: AC
  Filled 2024-10-12: qty 30

## 2024-10-12 MED ORDER — FENTANYL CITRATE (PF) 100 MCG/2ML IJ SOLN
INTRAMUSCULAR | Status: AC
  Filled 2024-10-12: qty 2

## 2024-10-12 MED ORDER — MORPHINE SULFATE (PF) 0.5 MG/ML IJ SOLN
INTRAMUSCULAR | Status: AC
  Filled 2024-10-12: qty 10

## 2024-10-12 MED ORDER — ACETAMINOPHEN 500 MG PO TABS
ORAL_TABLET | ORAL | Status: AC
  Filled 2024-10-12: qty 2

## 2024-10-12 MED ORDER — ZOLPIDEM TARTRATE 5 MG PO TABS: 5.0000 mg | ORAL_TABLET | Freq: Every evening | ORAL | Status: DC | PRN

## 2024-10-12 MED ORDER — OXYTOCIN-SODIUM CHLORIDE 30-0.9 UT/500ML-% IV SOLN: 2.5000 [IU]/h | INTRAVENOUS | Status: AC

## 2024-10-12 MED ORDER — LABETALOL HCL 5 MG/ML IV SOLN: 80.0000 mg | INTRAVENOUS | Status: DC | PRN

## 2024-10-12 MED ORDER — SCOPOLAMINE 1 MG/3DAYS TD PT72: 1.0000 | MEDICATED_PATCH | Freq: Once | TRANSDERMAL | Status: DC

## 2024-10-12 MED ORDER — ONDANSETRON HCL 4 MG/2ML IJ SOLN
INTRAMUSCULAR | Status: DC | PRN
  Administered 2024-10-12: 4 mg via INTRAVENOUS

## 2024-10-12 MED ORDER — MEASLES, MUMPS & RUBELLA VAC ~~LOC~~ SUSR: 0.5000 mL | Freq: Once | SUBCUTANEOUS | Status: DC

## 2024-10-12 MED ORDER — KETOROLAC TROMETHAMINE 30 MG/ML IJ SOLN
INTRAMUSCULAR | Status: AC
  Filled 2024-10-12: qty 1

## 2024-10-12 MED ORDER — GABAPENTIN 100 MG PO CAPS
100.0000 mg | ORAL_CAPSULE | Freq: Two times a day (BID) | ORAL | Status: DC
  Administered 2024-10-12 – 2024-10-15 (×7): 100 mg via ORAL
  Filled 2024-10-12 (×7): qty 1

## 2024-10-12 MED ORDER — PHENYLEPHRINE 80 MCG/ML (10ML) SYRINGE FOR IV PUSH (FOR BLOOD PRESSURE SUPPORT)
PREFILLED_SYRINGE | INTRAVENOUS | Status: AC
  Filled 2024-10-12: qty 20

## 2024-10-12 MED ORDER — PHENYLEPHRINE HCL-NACL 20-0.9 MG/250ML-% IV SOLN
INTRAVENOUS | Status: DC | PRN
  Administered 2024-10-12: 60 ug/min via INTRAVENOUS

## 2024-10-12 MED ORDER — FENTANYL CITRATE (PF) 100 MCG/2ML IJ SOLN
INTRAMUSCULAR | Status: DC | PRN
  Administered 2024-10-12: 15 ug via INTRATHECAL

## 2024-10-12 MED ORDER — ROCURONIUM BROMIDE 10 MG/ML (PF) SYRINGE
PREFILLED_SYRINGE | INTRAVENOUS | Status: AC
  Filled 2024-10-12: qty 10

## 2024-10-12 MED ORDER — CEFAZOLIN SODIUM-DEXTROSE 3-4 GM/150ML-% IV SOLN
3.0000 g | INTRAVENOUS | Status: AC
  Administered 2024-10-12: 3 g via INTRAVENOUS

## 2024-10-12 MED ORDER — GABAPENTIN 300 MG PO CAPS
ORAL_CAPSULE | ORAL | Status: AC
  Filled 2024-10-12: qty 1

## 2024-10-12 MED ORDER — OXYTOCIN-SODIUM CHLORIDE 30-0.9 UT/500ML-% IV SOLN
INTRAVENOUS | Status: DC | PRN
  Administered 2024-10-12: 300 mL via INTRAVENOUS

## 2024-10-12 MED ORDER — ACETAMINOPHEN 500 MG PO TABS
1000.0000 mg | ORAL_TABLET | Freq: Four times a day (QID) | ORAL | Status: DC
  Administered 2024-10-12 – 2024-10-15 (×11): 1000 mg via ORAL
  Filled 2024-10-12 (×13): qty 2

## 2024-10-12 MED ORDER — OXYCODONE HCL 5 MG PO TABS: 5.0000 mg | ORAL_TABLET | Freq: Once | ORAL | Status: DC | PRN

## 2024-10-12 MED ORDER — DIPHENHYDRAMINE HCL 25 MG PO CAPS: 25.0000 mg | ORAL_CAPSULE | ORAL | Status: DC | PRN

## 2024-10-12 MED ORDER — SIMETHICONE 80 MG PO CHEW
80.0000 mg | CHEWABLE_TABLET | Freq: Three times a day (TID) | ORAL | Status: DC
  Administered 2024-10-12 – 2024-10-15 (×11): 80 mg via ORAL
  Filled 2024-10-12 (×12): qty 1

## 2024-10-12 MED ORDER — KETOROLAC TROMETHAMINE 30 MG/ML IJ SOLN: 30.0000 mg | Freq: Once | INTRAMUSCULAR | Status: DC | PRN

## 2024-10-12 MED ORDER — TRANEXAMIC ACID-NACL 1000-0.7 MG/100ML-% IV SOLN
1000.0000 mg | Freq: Once | INTRAVENOUS | Status: AC
  Administered 2024-10-12: 1000 mg via INTRAVENOUS

## 2024-10-12 MED ORDER — ACETAMINOPHEN 500 MG PO TABS
1000.0000 mg | ORAL_TABLET | ORAL | Status: AC
  Administered 2024-10-12: 1000 mg via ORAL

## 2024-10-12 MED ORDER — NIFEDIPINE ER OSMOTIC RELEASE 30 MG PO TB24
60.0000 mg | ORAL_TABLET | Freq: Every day | ORAL | Status: DC
  Administered 2024-10-12 – 2024-10-13 (×2): 60 mg via ORAL
  Filled 2024-10-12 (×2): qty 2

## 2024-10-12 MED ORDER — CEFAZOLIN SODIUM-DEXTROSE 3-4 GM/150ML-% IV SOLN
INTRAVENOUS | Status: AC
  Filled 2024-10-12: qty 150

## 2024-10-12 MED ORDER — AMISULPRIDE (ANTIEMETIC) 5 MG/2ML IV SOLN: 10.0000 mg | Freq: Once | INTRAVENOUS | Status: DC | PRN

## 2024-10-12 MED ORDER — IBUPROFEN 600 MG PO TABS
600.0000 mg | ORAL_TABLET | Freq: Four times a day (QID) | ORAL | Status: DC
  Administered 2024-10-13 – 2024-10-15 (×7): 600 mg via ORAL
  Filled 2024-10-12 (×8): qty 1

## 2024-10-12 MED ORDER — LACTATED RINGERS IV SOLN: INTRAVENOUS | Status: DC

## 2024-10-12 NOTE — Anesthesia Procedure Notes (Signed)
 Spinal  Patient location during procedure: OR Start time: 10/12/2024 9:20 AM End time: 10/12/2024 9:24 AM Reason for block: surgical anesthesia  Staffing Performed: anesthesiologist  Authorized by: Merla Almarie HERO, DO   Performed by: Merla Almarie HERO, DO  Preanesthetic Checklist Completed: patient identified, IV checked, risks and benefits discussed, surgical consent, monitors and equipment checked, pre-op evaluation and timeout performed Spinal Block Patient position: sitting Prep: DuraPrep and site prepped and draped Patient monitoring: cardiac monitor, continuous pulse ox and blood pressure Approach: midline Location: L3-4 Injection technique: single-shot Needle Needle type: Pencan  Needle gauge: 24 G Needle length: 9 cm Assessment Sensory level: T6 Events: CSF return  Additional Notes Functioning IV was confirmed and monitors were applied. Sterile prep and drape, including hand hygiene and sterile gloves were used. The patient was positioned and the spine was prepped. The skin was anesthetized with lidocaine .  Free flow of clear CSF was obtained prior to injecting local anesthetic into the CSF.  The spinal needle aspirated freely following injection.  The needle was carefully withdrawn.  The patient tolerated the procedure well.

## 2024-10-12 NOTE — Lactation Note (Signed)
 This note was copied from a baby's chart. Lactation Consultation Note  Patient Name: Charlene Mora Unijb'd Date: 10/12/2024 Age:31 hours   LC brought MOB her STORK pump. MOB signed the received form. MOB denies having any questions or concerns. LC encouraged MOB to call for further assistance as needed.   Discharge Pump: Received Stork Pump  [employer] BS, IBCLC 10/12/2024, 6:12 PM

## 2024-10-12 NOTE — Lactation Note (Addendum)
 This note was copied from a baby's chart. Lactation Consultation Note  Patient Name: Charlene Mora Unijb'd Date: 10/12/2024 Age:31 hours Reason for consult: Initial assessment;Early term 85-38.6wks  P4, Mother is doubtful of her milk supply so she has started supplementing with formula. Provided education regarding milk volume expectations. Reviewed supply and demand.  Suggest breastfeeding before offering formula and calling for help with latching as needed.  Offer both breasts before formula to help mother establish her milk supply. Feed on demand with cues.  Goal 8-12+ times per day after first 24 hrs.  Place baby STS if not cueing.   Provided volume guidelines and a manual pump fitted with 18 mm flange.  Mother felt pull was strong of manual pump so suggest trying 21 mm flange. Recommend if mother is offering a bottle of formula she should pump for 10 min to stimulate her supply.  Sent [employer]ra  Stork pump referral.   Maternal Data Has patient been taught Hand Expression?: Yes Does the patient have breastfeeding experience prior to this delivery?: Yes How long did the patient breastfeed?: one month with twins and 3-4 mos. with other child  Feeding Mother's Current Feeding Choice: Breast Milk and Formula  LATCH Score Latch: Too sleepy or reluctant, no latch achieved, no sucking elicited.  Audible Swallowing: None  Type of Nipple: Everted at rest and after stimulation  Comfort (Breast/Nipple): Soft / non-tender  Hold (Positioning): Assistance needed to correctly position infant at breast and maintain latch.  LATCH Score: 5   Lactation Tools Discussed/Used Tools: Pump;Flanges Flange Size: 18;21 Breast pump type: Manual Pump Education: Milk Storage;Setup, frequency, and cleaning Reason for Pumping: stimulation Pumping frequency: PRN  Interventions Interventions: Breast feeding basics reviewed;Hand pump;Education;LC Services brochure;CDC milk storage  guidelines  Discharge Pump: Personal;Hands Free;Referral sent for [facility] Pump  Consult Status Consult Status: Follow-up Date: 10/13/24 Follow-up type: In-patient   Shannon Levorn Lemme  RN, IBCLC 10/12/2024, 2:05 PM

## 2024-10-12 NOTE — Anesthesia Postprocedure Evaluation (Signed)
"   Anesthesia Post Note  Patient: Charlene Mora  Procedure(s) Performed: CESAREAN DELIVERY (Abdomen)     Patient location during evaluation: PACU Anesthesia Type: Spinal Level of consciousness: awake and alert and oriented Pain management: pain level controlled Vital Signs Assessment: post-procedure vital signs reviewed and stable Respiratory status: spontaneous breathing, nonlabored ventilation and respiratory function stable Cardiovascular status: blood pressure returned to baseline and stable Postop Assessment: no headache, no backache, spinal receding and no apparent nausea or vomiting Anesthetic complications: no   No notable events documented.  Last Vitals:  Vitals:   10/12/24 1130 10/12/24 1145  BP: (!) 148/78 (!) 149/70  Pulse: 78 81  Resp: 17 18  Temp: 36.8 C   SpO2:  96%    Last Pain:  Vitals:   10/12/24 1130  TempSrc: Oral  PainSc:    Pain Goal:    LLE Motor Response: Purposeful movement (10/12/24 1145) LLE Sensation: Tingling (10/12/24 1145) RLE Motor Response: Purposeful movement (10/12/24 1145) RLE Sensation: Tingling (10/12/24 1145)     Epidural/Spinal Function Cutaneous sensation: Tingles (10/12/24 1145), Patient able to flex knees: No (10/12/24 1145), Patient able to lift hips off bed: No (10/12/24 1145), Back pain beyond tenderness at insertion site: No (10/12/24 1145), Progressively worsening motor and/or sensory loss: No (10/12/24 1145), Bowel and/or bladder incontinence post epidural: No (10/12/24 1145)  Almarie HERO Israa Caban      "

## 2024-10-12 NOTE — H&P (Signed)
 Charlene Mora is an 29 y.o. [phone] [redacted]w[redacted]d female.   Chief Complaint: Previous C-section x 2 HPI: Here at term with 2 prior C-sections who needs a delivery.  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 08/29/2024   STD (sexually transmitted disease)    Chlamydia    Past Surgical History:  Procedure Laterality Date   CESAREAN SECTION     CESAREAN SECTION N/A 05/07/2021   Procedure: CESAREAN SECTION;  Surgeon: Bonnielee Rayleen POUR, MD;  Location: [facility];  Service: Obstetrics;  Laterality: N/A;   TONSILLECTOMY     WISDOM TOOTH EXTRACTION      Family History  Problem Relation Age of Onset   Anemia Mother    Breast cancer Maternal Aunt    Hypertension Maternal Grandmother    Heart Problems Maternal Grandmother    Diabetes Maternal Grandfather    Social History:  reports that she has never smoked. She has never used smokeless tobacco. She reports that she does not currently use alcohol. She reports that she does not currently use drugs after having used the following drugs: Marijuana.   Allergies[1]  Medications Prior to Admission  Medication Sig Dispense Refill   ferrous sulfate  325 (65 FE) MG tablet Take 1 tablet (325 mg total) by mouth daily with breakfast. 30 tablet 3   hydrocortisone  (ANUSOL -HC) 2.5 % rectal cream Place rectally 4 (four) times daily. 30 g 1   labetalol  (NORMODYNE ) 100 MG tablet Take 1 tablet (100 mg total) by mouth 2 (two) times daily. 60 tablet 2   NIFEdipine  (PROCARDIA  XL) 60 MG 24 hr tablet Take 1 tablet (60 mg total) by mouth daily. 30 tablet 1   Prenatal Vit-Fe Fumarate-FA  (PRENATAL MULTIVITAMIN) TABS tablet Take 1 tablet by mouth daily at 12 noon.     valACYclovir  (VALTREX ) 500 MG tablet Take 1 tablet (500 mg total) by mouth 2 (two) times daily. (Patient taking differently: Take 500 mg by mouth daily as needed (Breakout).) 60 tablet 1   Blood Pressure Monitoring (BLOOD PRESSURE KIT) DEVI 1 Device by Does not apply route daily. 1 each 0   Misc. Devices (GOJJI WEIGHT SCALE) MISC 1 Device by Does not apply route once a week. 1 each 0   NIFEdipine  (PROCARDIA  XL/NIFEDICAL-XL) 90 MG 24 hr tablet Take 1 tablet (90 mg total) by mouth daily. 30 tablet 4     A comprehensive review of systems was negative.  Objective: Last menstrual period 01/21/2024. General appearance: alert, cooperative, appears stated age, and moderately obese Head: Normocephalic, without obvious abnormality, atraumatic Neck: supple, symmetrical, trachea midline Lungs: normal effort Heart: regular rate and rhythm Abdomen: gravid, Non-tender Extremities: edema trace Neurologic: Grossly normal   Labs: No results found for this or any previous visit (from the past 24 hours).          ABO,  Rh: --/--/B POS (01/14 1019)  Antibody: NEG (01/14 1019)  Rubella: Immune (10/08 0000)  RPR: NON REACTIVE (01/14 1030)  HBsAg: Negative (10/08 0000)  HIV: Non Reactive (11/12 1701)  GBS: Negative/-- (01/02 1141)    Radiology studies: US  MFM OB DETAIL +14 WK Result Date: 09/21/2024 ----------------------------------------------------------------------  OBSTETRICS REPORT                    (Corrected Final 09/21/2024 03:56 pm) ---------------------------------------------------------------------- Patient Info  ID #:       [id number]                          D.O.B.:  09/15/1996 (28 yrs)(F)  Name:       Charlene Mora               Visit Date: 09/14/2024 12:33 pm ---------------------------------------------------------------------- Performed By  Attending:        Delora Smaller DO       Ref. Address:     [address]                                                             [address]                                                             [place], [place]                                                             [address]  Performed By:     Meade Parker       [facility]  Referred By:      [facility]                    [place] [facility]                    [place] ---------------------------------------------------------------------- Orders  #  Description                           Code        Ordered By  1  US  MFM OB DETAIL +14 WK               [id number]    DONNA LO  2  US  MFM FETAL BPP WO NON               76819.01    [facility]     STRESS ----------------------------------------------------------------------  #  Order #                     Accession #                Episode #  1  [id number]                   [id number]                 [id number]  2  [id number]                   [id number]                 [id number] ---------------------------------------------------------------------- Indications  Polyhydramnios, third trimester, antepartum    O40.3XX0  condition or complication, singleton fetus  Gestational hypertension, third trimester      O13.3  Maternal morbid obesity                        O99.210 E66.01  Previous cesarean delivery, antepartum         O34.219  [redacted] weeks gestation of pregnancy                Z3A.34 ---------------------------------------------------------------------- Vital Signs  BP:          142/67 ---------------------------------------------------------------------- Fetal Evaluation  Num Of Fetuses:         1  Fetal Heart Rate(bpm):  136  Cardiac Activity:       Observed  Presentation:            Variable  Placenta:               Anterior  P. Cord Insertion:      Visualized, central  Amniotic Fluid  AFI FV:      Polyhydramnios  AFI Sum(cm)     %Tile       Largest Pocket(cm)  27.14           97          7.27  RUQ(cm)       RLQ(cm)       LUQ(cm)        LLQ(cm)  6.65          7.27          6.3            6.92 ---------------------------------------------------------------------- Biophysical Evaluation  Amniotic F.V:   Polyhydramnios             F. Tone:  Observed  F. Movement:    Observed                   Score:          8/8  F. Breathing:   Observed ---------------------------------------------------------------------- Biometry  BPD:      87.9  mm     G. Age:  35w 4d         71  %    CI:        70.09   %    70 - 86                                                          FL/HC:      19.1   %    20.1 - 22.3  HC:      334.9  mm     G. Age:  38w 2d         92  %    HC/AC:      1.12        0.93 - 1.11  AC:      298.5  mm     G. Age:  33w 6d         26  %    FL/BPD:     72.8   %    71 - 87  FL:         64  mm     G. Age:  33w 0d          7  %    FL/AC:      21.4   %    20 - 24  HUM:      60.8  mm     G. Age:  35w 2d         72  %  CER:      42.8  mm     G. Age:  33w 6d         14  %  LV:        2.3  mm  Est. FW:    2376  gm      5 lb 4 oz     28  %  Est. FW at 39 Wks:       3183  gm          7 lb ---------------------------------------------------------------------- OB History  Gravidity:    5         Term:   2        Prem:   0        SAB:   0  TOP:          1       Ectopic:  1        Living: 3 ---------------------------------------------------------------------- Gestational Age  LMP:           33w 6d        Date:  01/21/24                  EDD:   10/27/24  U/S Today:     35w 1d  EDD:   10/18/24  Best:          34w 6d     Det. By:  Early Ultrasound         EDD:   10/20/24                                      (07/03/24)  ---------------------------------------------------------------------- Targeted Anatomy  Central Nervous System  Calvarium/Cranial V.:  Appears normal         Cereb./Vermis:          Appears normal  Cavum:                 Appears normal         Cisterna Magna:         Appears normal  Lateral Ventricles:    Appears normal         Midline Falx:           Appears normal  Choroid Plexus:        Appears normal  Spine  Cervical:              Not well visualized    Sacral:                 Not well visualized  Thoracic:              Not well visualized    Shape/Curvature:        Not well visualized  Lumbar:                Not well visualized  Head/Neck  Lips:                  Appears normal         Profile:                Appears normal  Neck:                  Appears normal         Orbits/Eyes:            Appears normal  Nuchal Fold:           Not well visualized    Mandible:               Not well visualized  Nasal Bone:            Present                Maxilla:                Not well visualized  Thorax  4 Chamber View:        Appears normal         SVC:                    Not well visualized  Cardiac Activity:      Observed               Interventr. Septum:     Not well visualized  Cardiac Rhythm:        Normal                 Cardiac Axis:           Normal  Cardiac Situs:         Appears normal  Diaphragm:              Appears normal  Rt Outflow Tract:      Appears normal         3 Vessel View:          Not well visualized  Lt Outflow Tract:      Appears normal         3 V Trachea View:       Not well visualized  Aortic Arch:           Not well visualized    IVC:                    Not well visualized  Ductal Arch:           Not well visualized    Crossing:               Appears normal  Abdomen  Ventral Wall:          Appears normal         Lt Kidney:              Appears normal  Cord Insertion:        Appears normal         Rt Kidney:              Appears normal  Situs:                 Appears normal          Bladder:                Appears normal  Stomach:               Appears normal  Extremities  Lt Humerus:            Appears normal         Lt Femur:               Appears normal  Rt Humerus:            Appears normal         Rt Femur:               Appears normal  Lt Forearm:            Appears normal         Lt Lower Leg:           Appears normal  Rt Forearm:            Appears normal         Rt Lower Leg:           Appears normal  Lt Hand:               Open hand nml          Lt Foot:                Not well visualized  Rt Hand:               Open hand nml          Rt Foot:                Not well visualized  Other  Umbilical Cord:        Not well visualized    Genitalia:              Female-nml ---------------------------------------------------------------------- Cervix  Uterus Adnexa  Cervix  Not visualized (advanced GA >24wks)  Uterus  No abnormality visualized.  Right Ovary  Not visualized.  Left Ovary  Not visualized.  Cul De Sac  No free fluid seen.  Adnexa  No abnormality visualized ---------------------------------------------------------------------- Comments  Sonographic findings  Single intrauterine pregnancy at 34w 6d.  Fetal cardiac activity:  Observed and appears normal.  Presentation: Variable.  The anatomic structures that were well seen appear normal.  Due to poor acoustic windows some structures remain  suboptimally visualized.  Fetal biometry shows the estimated fetal weight of 5 lb 4 oz,  2376 grams (28%).  Amniotic fluid: Polyhydramnios.  MVP: 7.27 cm.  Placenta: Anterior.  Adnexa: No abnormality visualized.  BPP 8/8.  There are limitations of prenatal ultrasound such as the  inability to detect certain abnormalities due to poor  visualization. Various factors such as fetal position,  gestational age and maternal body habitus may increase the  difficulty in visualizing the fetal anatomy.  Recommendations  - See Epic note for assessment and plan of care. Any  referring office that does not utilize  Epic will recieve a copy of  today's consult note via fax. Please contact our office with  any concerns. ----------------------------------------------------------------------                       Delora Smaller, DO Electronically Signed Corrected Final Report  09/21/2024 03:56 pm ----------------------------------------------------------------------   US  MFM FETAL BPP WO NON STRESS Result Date: 09/21/2024 ----------------------------------------------------------------------  OBSTETRICS REPORT                    (Corrected Final 09/21/2024 03:56 pm) ---------------------------------------------------------------------- Patient Info  ID #:       [id number]                          D.O.B.:  Aug 04, 1996 (28 yrs)(F)  Name:       Charlene Rosato               Visit Date: 09/14/2024 12:33 pm ---------------------------------------------------------------------- Performed By  Attending:        Delora Smaller DO       Ref. Address:     [address]                                                             [address]                                                             [place], [place]  [address]  Performed By:     Meade Parker       [facility]  Referred By:      [facility]                    [facility]                    [place] ---------------------------------------------------------------------- Orders  #  Description                           Code        Ordered By  1  US  MFM OB DETAIL +14 WK               76811.01    DONNA LO  2  US  MFM FETAL BPP WO NON               76819.01    [facility]     STRESS  ----------------------------------------------------------------------  #  Order #                     Accession #                Episode #  1  [id number]                   [id number]                 [id number]  2  [id number]                   [id number]                 [id number] ---------------------------------------------------------------------- Indications  Polyhydramnios, third trimester, antepartum    O40.3XX0  condition or complication, singleton fetus  Gestational hypertension, third trimester      O13.3  Maternal morbid obesity                        O99.210 E66.01  Previous cesarean delivery, antepartum         O34.219  [redacted] weeks gestation of pregnancy                Z3A.34 ---------------------------------------------------------------------- Vital Signs  BP:          142/67 ----------------------------------------------------------------------  Fetal Evaluation  Num Of Fetuses:         1  Fetal Heart Rate(bpm):  136  Cardiac Activity:       Observed  Presentation:           Variable  Placenta:               Anterior  P. Cord Insertion:      Visualized, central  Amniotic Fluid  AFI FV:      Polyhydramnios  AFI Sum(cm)     %Tile       Largest Pocket(cm)  27.14           97          7.27  RUQ(cm)       RLQ(cm)       LUQ(cm)        LLQ(cm)  6.65          7.27          6.3            6.92 ---------------------------------------------------------------------- Biophysical Evaluation  Amniotic F.V:   Polyhydramnios             F. Tone:        Observed  F. Movement:    Observed                   Score:          8/8  F. Breathing:   Observed ---------------------------------------------------------------------- Biometry  BPD:      87.9  mm     G. Age:  35w 4d         71  %    CI:        70.09   %    70 - 86                                                          FL/HC:      19.1   %    20.1 - 22.3  HC:      334.9  mm     G. Age:  38w 2d         92  %    HC/AC:      1.12        0.93 - 1.11  AC:      298.5  mm     G.  Age:  33w 6d         26  %    FL/BPD:     72.8   %    71 - 87  FL:         64  mm     G. Age:  33w 0d          7  %    FL/AC:      21.4   %    20 - 24  HUM:      60.8  mm     G. Age:  35w 2d         72  %  CER:      42.8  mm     G. Age:  33w 6d         14  %  LV:  2.3  mm  Est. FW:    2376  gm      5 lb 4 oz     28  %  Est. FW at 39 Wks:       3183  gm          7 lb ---------------------------------------------------------------------- OB History  Gravidity:    5         Term:   2        Prem:   0        SAB:   0  TOP:          1       Ectopic:  1        Living: 3 ---------------------------------------------------------------------- Gestational Age  LMP:           33w 6d        Date:  01/21/24                  EDD:   10/27/24  U/S Today:     35w 1d                                        EDD:   10/18/24  Best:          34w 6d     Det. By:  Early Ultrasound         EDD:   10/20/24                                      (07/03/24) ---------------------------------------------------------------------- Targeted Anatomy  Central Nervous System  Calvarium/Cranial V.:  Appears normal         Cereb./Vermis:          Appears normal  Cavum:                 Appears normal         Cisterna Magna:         Appears normal  Lateral Ventricles:    Appears normal         Midline Falx:           Appears normal  Choroid Plexus:        Appears normal  Spine  Cervical:              Not well visualized    Sacral:                 Not well visualized  Thoracic:              Not well visualized    Shape/Curvature:        Not well visualized  Lumbar:                Not well visualized  Head/Neck  Lips:                  Appears normal         Profile:                Appears normal  Neck:                  Appears normal         Orbits/Eyes:            Appears  normal  Nuchal Fold:           Not well visualized    Mandible:               Not well visualized  Nasal Bone:            Present                Maxilla:                Not well visualized   Thorax  4 Chamber View:        Appears normal         SVC:                    Not well visualized  Cardiac Activity:      Observed               Interventr. Septum:     Not well visualized  Cardiac Rhythm:        Normal                 Cardiac Axis:           Normal  Cardiac Situs:         Appears normal         Diaphragm:              Appears normal  Rt Outflow Tract:      Appears normal         3 Vessel View:          Not well visualized  Lt Outflow Tract:      Appears normal         3 V Trachea View:       Not well visualized  Aortic Arch:           Not well visualized    IVC:                    Not well visualized  Ductal Arch:           Not well visualized    Crossing:               Appears normal  Abdomen  Ventral Wall:          Appears normal         Lt Kidney:              Appears normal  Cord Insertion:        Appears normal         Rt Kidney:              Appears normal  Situs:                 Appears normal         Bladder:                Appears normal  Stomach:               Appears normal  Extremities  Lt Humerus:            Appears normal         Lt Femur:               Appears normal  Rt Humerus:            Appears normal         Rt Femur:  Appears normal  Lt Forearm:            Appears normal         Lt Lower Leg:           Appears normal  Rt Forearm:            Appears normal         Rt Lower Leg:           Appears normal  Lt Hand:               Open hand nml          Lt Foot:                Not well visualized  Rt Hand:               Open hand nml          Rt Foot:                Not well visualized  Other  Umbilical Cord:        Not well visualized    Genitalia:              Female-nml ---------------------------------------------------------------------- Cervix Uterus Adnexa  Cervix  Not visualized (advanced GA >24wks)  Uterus  No abnormality visualized.  Right Ovary  Not visualized.  Left Ovary  Not visualized.  Cul De Sac  No free fluid seen.  Adnexa  No abnormality visualized  ---------------------------------------------------------------------- Comments  Sonographic findings  Single intrauterine pregnancy at 34w 6d.  Fetal cardiac activity:  Observed and appears normal.  Presentation: Variable.  The anatomic structures that were well seen appear normal.  Due to poor acoustic windows some structures remain  suboptimally visualized.  Fetal biometry shows the estimated fetal weight of 5 lb 4 oz,  2376 grams (28%).  Amniotic fluid: Polyhydramnios.  MVP: 7.27 cm.  Placenta: Anterior.  Adnexa: No abnormality visualized.  BPP 8/8.  There are limitations of prenatal ultrasound such as the  inability to detect certain abnormalities due to poor  visualization. Various factors such as fetal position,  gestational age and maternal body habitus may increase the  difficulty in visualizing the fetal anatomy.  Recommendations  - See Epic note for assessment and plan of care. Any  referring office that does not utilize Epic will recieve a copy of  today's consult note via fax. Please contact our office with  any concerns. ----------------------------------------------------------------------                       Delora Smaller, DO Electronically Signed Corrected Final Report  09/21/2024 03:56 pm ----------------------------------------------------------------------    Prenatal Transfer Tool  Maternal Diabetes: No Genetic Screening: Normal Maternal Ultrasounds/Referrals: Normal Poly Fetal Ultrasounds or other Referrals:  None Maternal Substance Abuse:  No Significant Maternal Medications:  Meds include: Other: Iron, Labetalol , Nifedipine , Valtrex  Significant Maternal Lab Results: Group B Strep negative  Assessment/Plan Principal Problem:   Status post cesarean section  For RCS Risks include but are not limited to bleeding, infection, injury to surrounding structures, including bowel, bladder and ureters, blood clots, and death.  Likelihood of success is high. Planning formula,  Nexplanon , circ  Glenys GORMAN Birk 10/12/2024, 8:38 AM      [1] No Known Allergies

## 2024-10-12 NOTE — Transfer of Care (Signed)
 Immediate Anesthesia Transfer of Care Note  Patient: Charlene Mora  Procedure(s) Performed: CESAREAN DELIVERY (Abdomen)  Patient Location: PACU  Anesthesia Type:Spinal  Level of Consciousness: awake, alert , and oriented  Airway & Oxygen Therapy: Patient Spontanous Breathing  Post-op Assessment: Report given to RN and Post -op Vital signs reviewed and stable  Post vital signs: Reviewed and stable  Last Vitals:  Vitals Value Taken Time  BP 146/75 10/12/24 10:45  Temp    Pulse 67 10/12/24 10:45  Resp 12 10/12/24 10:45  SpO2 94 % 10/12/24 10:45  Vitals shown include unfiled device data.  Last Pain:  Vitals:   10/12/24 0843  TempSrc: Oral         Complications: No notable events documented.

## 2024-10-12 NOTE — Op Note (Signed)
 Preoperative Diagnosis:  IUP @ [redacted]w[redacted]d, Previous C-section x 2  Postoperative Diagnosis:  Same  Procedure: repeat low transverse cesarean section  Surgeon: Glenys Birk, M.D.  Assistant: Charlie Courts, MD An experienced assistant was required given the standard of surgical care given the complexity of the case.  This assistant was needed for exposure, dissection, suctioning, retraction, instrument exchange, assisting with delivery with administration of fundal pressure, and for overall help during the procedure.  Findings: Viable female infant, APGAR (1 MIN): 9  APGAR (5 MINS): 9  Weight pending  presentation, vertex position Large 7-8 cm LUS right sided fibroid  Estimated blood loss: 700 cc  Complications: None known  Specimens: Placenta to labor and delivery  Reason for procedure: Briefly, the patient is a 29 y.o. H4E7976 [redacted]w[redacted]d who presents for RCS.  Procedure: Patient is a to the OR where spinal analgesia was administered. She was then placed in a supine position with left lateral tilt. She received 3 g of Ancef  and SCDs were in place. A timeout was performed. She was prepped and draped in the usual sterile fashion. A Foley catheter was placed in the bladder. A knife was then used to make a Pfannenstiel incision. This incision was carried out to underlying fascia which was divided in the midline with the knife. The incision was extended laterally, sharply. The fascia was dissected off the rectus superiorly bluntly laterally and sharply in the midline entering the peritoneal cavity. Incision extended bluntly.  Alexis retractor was placed inside the incision.  A knife was used to make a low transverse incision on the uterus. Attempts were made to avoid the fibroid. This incision was carried down to the amniotic cavity was entered. Fetus was in vertex position and was brought up out of the incision without difficulty. Delayed cord clamp x 1 minute. Cord was clamped x 2 and cut. Infant taken to  waiting nurse.  Cord blood was obtained. Placenta was delivered from the uterus.  Uterus was cleaned with dry lap pads. Uterine incision closed with 0 Monocryl suture in a running fashion. A figure of eight x 2 were used on the right corner due to bleeding and difficulty in closing due to proximity of the fibroid. Pressure and sutures and surgicel powder achieved hemostasis. Alexis retractor was removed from the abdomen. Peritoneal closure could not be completed due to absence of it. The right inferior epigastric was bleeding and this was stopped with  with 0 Monocryl suture.  Fascia is closed with 0 Vicryl suture in a running fashion. Subcutaneous tissue infused with 30cc 0.25% Marcaine .  Subcutaneous closure was performed with 0 plain suture.  Skin closed using 3-0 Vicryl on a Keith needle.  Honeycomb applied, followed by pressure dressing.  All instrument, needle and lap counts were correct x 2.  Patient was awake and taken to PACU stable.  Infant remained with mom in couplet care, stable.   Glenys RAMAN PrattMD 10/12/2024 10:34 AM

## 2024-10-12 NOTE — Discharge Summary (Signed)
 "    Postpartum Discharge Summary  Date of Service updated***     Patient Name: Charlene Mora DOB: 25-Mar-1996 MRN: [id number]  Date of admission: 10/12/2024 Delivery date:10/12/2024 Delivering provider: FREDIRICK GLENYS RAMAN Date of discharge: 10/12/2024  Admitting diagnosis: [redacted] weeks gestation of pregnancy [Z3A.37] Maternal care due to low transverse uterine scar from previous cesarean delivery [O34.211] Status post cesarean section [Z98.891] Intrauterine pregnancy: [redacted]w[redacted]d     Secondary diagnosis:  Principal Problem:   Status post cesarean section Active Problems:   Essential hypertension during pregnancy   Maternal morbid obesity in third trimester, antepartum (HCC)   Genital herpes simplex   Polyhydramnios affecting pregnancy  Additional problems: None    Discharge diagnosis: Term Pregnancy Delivered                                              Post partum procedures:{Postpartum procedures:23558} Augmentation: N/A Complications: {OB Labor/Delivery Complications:20784}  Hospital course: Sceduled C/S   29 y.o. yo H4E6975 at [redacted]w[redacted]d was admitted to the hospital 10/12/2024 for scheduled cesarean section with the following indication:Elective Repeat.Delivery details are as follows:  Membrane Rupture Time/Date: 9:46 AM,10/12/2024  Delivery Method:C-Section, Low Transverse Operative Delivery:N/A Details of operation can be found in separate operative note.  Patient had a postpartum course complicated by HTN. Placed on home Procardia  and given Lasix  and KCL She was given Lovenox  on POD#1 and throughout hospitalization.    She is ambulating, tolerating a regular diet, passing flatus, and urinating well. Patient is discharged home in stable condition on  10/12/24        Newborn Data: Birth date:10/12/2024 Birth time:9:47 AM Gender:Female Living status:Living Apgars:9 ,9  Weight:3330 g    Magnesium Sulfate received: No BMZ received: No Rhophylac:N/A MMR:N/A T-DaP:Given prenatally Flu:  No RSV Vaccine received: No Transfusion:{Transfusion received:30440034}  Immunizations received: Immunization History  Administered Date(s) Administered   PPD Test 04/11/2023   Tdap 08/08/2024    Physical exam  Vitals:   10/12/24 0843 10/12/24 1045  BP: (!) 140/93 (!) 146/75  Pulse: 97 67  Resp: 18 12  Temp: 98 F (36.7 C)   TempSrc: Oral   SpO2: 97% 94%  Weight: (!) 144.6 kg   Height: 5' 7 (1.702 m)    General: alert, cooperative, and no distress Lochia: appropriate Uterine Fundus: firm Incision: {Exam; incision:21111123} DVT Evaluation: {Exam; icu:7888877} Labs: Lab Results  Component Value Date   WBC 14.7 (H) 10/10/2024   HGB 9.9 (L) 10/10/2024   HCT 32.1 (L) 10/10/2024   MCV 79.5 (L) 10/10/2024   PLT 267 10/10/2024      Latest Ref Rng & Units 09/25/2024   11:25 AM  CMP  Glucose 70 - 99 mg/dL 93   BUN 6 - 20 mg/dL 6   Creatinine 9.42 - 8.99 mg/dL 9.49   Sodium 865 - 855 mmol/L 136   Potassium 3.5 - 5.2 mmol/L 4.2   Chloride 96 - 106 mmol/L 103   CO2 20 - 29 mmol/L 19   Calcium 8.7 - 10.2 mg/dL 8.9   Total Protein 6.0 - 8.5 g/dL 6.3   Total Bilirubin 0.0 - 1.2 mg/dL <9.7   Alkaline Phos 41 - 116 IU/L 83   AST 0 - 40 IU/L 9   ALT 0 - 32 IU/L 5    Edinburgh Score:    05/09/2021   10:00 AM  [facility]  Postnatal Depression Scale Screening Tool  I have been able to laugh and see the funny side of things. 0   I have looked forward with enjoyment to things. 0   I have blamed myself unnecessarily when things went wrong. 0   I have been anxious or worried for no good reason. 0   I have felt scared or panicky for no good reason. 0   Things have been getting on top of me. 0   I have been so unhappy that I have had difficulty sleeping. 0   I have felt sad or miserable. 0   I have been so unhappy that I have been crying. 0   The thought of harming myself has occurred to me. 0   Edinburgh Postnatal Depression Scale Total 0      Data saved with a previous  flowsheet row definition   No data recorded  After visit meds:  Allergies as of 10/12/2024   No Known Allergies   Med Rec must be completed prior to using this [facility]***        Discharge home in stable condition Infant Feeding: Bottle Infant Disposition:home with mother Discharge instruction: per After Visit Summary and Postpartum booklet. Activity: Advance as tolerated. Pelvic rest for 6 weeks.  Diet: low salt diet Future Appointments:No future appointments. Follow up Visit:  Follow-up Information     [facility] for [facility] at [facility] Follow up in 1 week(s).   Specialty: Obstetrics and Gynecology Why: postop check, blood pressure check, they will call you with an appointment Contact information: [address] [place]  [address] [phone]               Msg sent by T. Fredirick 10/12/2024  Please schedule this patient for a In person postpartum visit in 4 weeks with the following provider: MD. Additional Postpartum F/U:Incision check 1 week and BP check 1 week  High risk pregnancy complicated by: HTN Delivery mode:  C-Section, Low Transverse Anticipated Birth Control:  Nexplanon    10/12/2024 Glenys GORMAN Fredirick, MD    "

## 2024-10-13 LAB — CBC
HCT: 28.8 % — ABNORMAL LOW (ref 36.0–46.0)
Hemoglobin: 9.1 g/dL — ABNORMAL LOW (ref 12.0–15.0)
MCH: 24.9 pg — ABNORMAL LOW (ref 26.0–34.0)
MCHC: 31.6 g/dL (ref 30.0–36.0)
MCV: 78.9 fL — ABNORMAL LOW (ref 80.0–100.0)
Platelets: 244 K/uL (ref 150–400)
RBC: 3.65 MIL/uL — ABNORMAL LOW (ref 3.87–5.11)
RDW: 16.4 % — ABNORMAL HIGH (ref 11.5–15.5)
WBC: 20 K/uL — ABNORMAL HIGH (ref 4.0–10.5)
nRBC: 0 % (ref 0.0–0.2)

## 2024-10-13 MED ORDER — FERROUS SULFATE 325 (65 FE) MG PO TABS
325.0000 mg | ORAL_TABLET | ORAL | Status: DC
  Administered 2024-10-13 – 2024-10-15 (×2): 325 mg via ORAL
  Filled 2024-10-13 (×2): qty 1

## 2024-10-13 MED ORDER — NIFEDIPINE ER OSMOTIC RELEASE 30 MG PO TB24
90.0000 mg | ORAL_TABLET | Freq: Every day | ORAL | Status: DC
  Administered 2024-10-14 – 2024-10-15 (×2): 90 mg via ORAL
  Filled 2024-10-13 (×2): qty 3

## 2024-10-13 NOTE — Progress Notes (Signed)
 POSTPARTUM PROGRESS NOTE  POD #1  Subjective:  MACIL CRADY is a 29 y.o. H4E6975 s/p rLTCS at [redacted]w[redacted]d.  She reports she doing well. No acute events overnight. She reports she is doing well. She denies any problems with ambulating, voiding or po intake. Denies nausea or vomiting.  Pain is well controlled.  Lochia is appropriate.  Objective: Blood pressure 138/64, pulse 73, temperature 98.2 F (36.8 C), temperature source Oral, resp. rate 18, height 5' 7 (1.702 m), weight (!) 144.6 kg, last menstrual period 01/21/2024, SpO2 100%, unknown if currently breastfeeding.  Physical Exam:  General: alert, cooperative and no distress Chest: no respiratory distress Heart:regular rate, distal pulses intact Abdomen: soft, nontender,  Uterine Fundus: firm, appropriately tender DVT Evaluation: No calf swelling or tenderness Extremities: trace edema Skin: warm, dry; incision clean/dry/intact w/ honeycomb and pressure dressing in place  Recent Labs    10/13/24 0434  HGB 9.1*  HCT 28.8*    Assessment/Plan: KARLISA GAUBERT is a 29 y.o. H4E6975 s/p rLTCS at [redacted]w[redacted]d for prior CS x 2.  POD#1 - Doing welll; pain well controlled. H/H appropriate  Routine postpartum care  OOB, ambulated  Lovenox  for VTE prophylaxis Acute blood loss anemia significant for this hospitalization: asymptomatic  Start po ferrous sulfate  BID  Contraception: Undecided, thinking about Nexplanon  at postpartum visit Feeding: Breast  Dispo: Plan for discharge tomorrow.   LOS: 1 day   Steffan Rover, MD Attending Family Medicine Physician, [facility] for [facility], [facility]   10/13/2024, 11:37 AM

## 2024-10-14 NOTE — Lactation Note (Addendum)
 This note was copied from a baby's chart. Lactation Consultation Note  Patient Name: Charlene Mora Unijb'd Date: 10/14/2024 Age:31 hours Reason for consult: Follow-up assessment;Early term 37-38.6wks Maternal Data   P4. Mom holding baby STS when LC came into rm. To check on mom. Mom is latching but also is going to the breast. Mom is supplementing w/ Kendamil powered formula. Encouraged to BF first then supplement if needed. Mom stated she doesn't think she has enough milk. Reminded mom how small the baby's stomach is. Mom stated she felt good about how BF was going. Encouraged mom to call for BF assistance if needed. Feeding Mother's Current Feeding Choice: Breast Milk and Formula Nipple Type: Slow - flow  LATCH Score                    Lactation Tools Discussed/Used    Interventions Interventions: Skin to skin;Education;Pace feeding  Discharge    Consult Status Consult Status: Follow-up Date: 10/14/24 Follow-up type: In-patient    [name] G 10/14/2024, 4:12 AM

## 2024-10-14 NOTE — Progress Notes (Signed)
 Post Partum Day 2 s/p rLTCS Subjective: Notes more pain today but controlled with PO medications  Objective: Blood pressure 139/60, pulse 86, temperature 98.3 F (36.8 C), temperature source Oral, resp. rate 19, height 5' 7 (1.702 m), weight (!) 144.6 kg, last menstrual period 01/21/2024, SpO2 100%, unknown if currently breastfeeding.  Physical Exam:  General: alert, cooperative, and no distress Lochia: appropriate Uterine Fundus: firm Incision: pressure dressing in place, c/d/i DVT Evaluation: No evidence of DVT seen on physical exam.  Recent Labs    10/13/24 0434  HGB 9.1*  HCT 28.8*    Assessment/Plan: Postpartum Recovering well - Contraception: Nexplanon  - discussed inpatient vs outpatient placement and she'd like to do outpatient - MOF: formula - Rh status: Rh+ - Rubella status: RI - Dispo: anticipate discharge home POD3  2. cHTN Asymptomatic, no e/o SF Mild range BP, increased medications to procardia  90 daily Lasix /K x 5d PP  3. Neonatal - Doing well at bedside, s/p circumcision   LOS: 2 days   Kieth JAYSON Carolin 10/14/2024, 1:03 PM

## 2024-10-15 ENCOUNTER — Ambulatory Visit

## 2024-10-15 ENCOUNTER — Other Ambulatory Visit (HOSPITAL_COMMUNITY): Payer: Self-pay

## 2024-10-15 ENCOUNTER — Encounter (HOSPITAL_BASED_OUTPATIENT_CLINIC_OR_DEPARTMENT_OTHER): Payer: Self-pay

## 2024-10-15 DIAGNOSIS — O0990 Supervision of high risk pregnancy, unspecified, unspecified trimester: Secondary | ICD-10-CM

## 2024-10-15 DIAGNOSIS — Z975 Presence of (intrauterine) contraceptive device: Secondary | ICD-10-CM

## 2024-10-15 DIAGNOSIS — O10019 Pre-existing essential hypertension complicating pregnancy, unspecified trimester: Secondary | ICD-10-CM

## 2024-10-15 DIAGNOSIS — O409XX0 Polyhydramnios, unspecified trimester, not applicable or unspecified: Secondary | ICD-10-CM

## 2024-10-15 MED ORDER — FUROSEMIDE 20 MG PO TABS
20.0000 mg | ORAL_TABLET | Freq: Every day | ORAL | 0 refills | Status: AC
  Filled 2024-10-15: qty 2, 2d supply, fill #0

## 2024-10-15 MED ORDER — LIDOCAINE HCL 1 % IJ SOLN
0.0000 mL | Freq: Once | INTRAMUSCULAR | Status: AC | PRN
  Administered 2024-10-15: 20 mL via INTRADERMAL
  Filled 2024-10-15: qty 20

## 2024-10-15 MED ORDER — NIFEDIPINE ER 90 MG PO TB24
90.0000 mg | ORAL_TABLET | Freq: Every day | ORAL | 1 refills | Status: AC
  Filled 2024-10-15: qty 90, 90d supply, fill #0

## 2024-10-15 MED ORDER — OXYCODONE HCL 5 MG PO TABS
5.0000 mg | ORAL_TABLET | ORAL | 0 refills | Status: AC | PRN
  Filled 2024-10-15: qty 20, 2d supply, fill #0

## 2024-10-15 MED ORDER — IBUPROFEN 600 MG PO TABS
600.0000 mg | ORAL_TABLET | Freq: Four times a day (QID) | ORAL | 0 refills | Status: AC
  Filled 2024-10-15: qty 30, 8d supply, fill #0

## 2024-10-15 MED ORDER — FERROUS SULFATE 325 (65 FE) MG PO TABS
325.0000 mg | ORAL_TABLET | ORAL | 0 refills | Status: AC
  Filled 2024-10-15: qty 30, 60d supply, fill #0

## 2024-10-15 MED ORDER — LABETALOL HCL 200 MG PO TABS
200.0000 mg | ORAL_TABLET | Freq: Two times a day (BID) | ORAL | Status: DC
  Administered 2024-10-15: 200 mg via ORAL
  Filled 2024-10-15: qty 1

## 2024-10-15 MED ORDER — POTASSIUM CHLORIDE CRYS ER 20 MEQ PO TBCR
20.0000 meq | EXTENDED_RELEASE_TABLET | Freq: Every day | ORAL | 0 refills | Status: AC
  Filled 2024-10-15: qty 2, 2d supply, fill #0

## 2024-10-15 MED ORDER — ACETAMINOPHEN 500 MG PO TABS
1000.0000 mg | ORAL_TABLET | Freq: Four times a day (QID) | ORAL | 0 refills | Status: AC
  Filled 2024-10-15: qty 30, 4d supply, fill #0

## 2024-10-15 MED ORDER — LABETALOL HCL 200 MG PO TABS
200.0000 mg | ORAL_TABLET | Freq: Two times a day (BID) | ORAL | 2 refills | Status: AC
  Filled 2024-10-15: qty 60, 30d supply, fill #0

## 2024-10-15 MED ORDER — ETONOGESTREL 68 MG ~~LOC~~ IMPL
68.0000 mg | DRUG_IMPLANT | Freq: Once | SUBCUTANEOUS | Status: AC
  Administered 2024-10-15: 68 mg via SUBCUTANEOUS
  Filled 2024-10-15: qty 1

## 2024-10-15 NOTE — Plan of Care (Signed)
 " Problem: Education: Goal: Knowledge of General Education information will improve Description: Including pain rating scale, medication(s)/side effects and non-pharmacologic comfort measures Outcome: Adequate for Discharge   Problem: Health Behavior/Discharge Planning: Goal: Ability to manage health-related needs will improve Outcome: Adequate for Discharge   Problem: Clinical Measurements: Goal: Ability to maintain clinical measurements within normal limits will improve Outcome: Adequate for Discharge Goal: Will remain free from infection Outcome: Adequate for Discharge Goal: Diagnostic test results will improve Outcome: Adequate for Discharge Goal: Respiratory complications will improve Outcome: Adequate for Discharge Goal: Cardiovascular complication will be avoided Outcome: Adequate for Discharge   Problem: Activity: Goal: Risk for activity intolerance will decrease Outcome: Adequate for Discharge   Problem: Nutrition: Goal: Adequate nutrition will be maintained Outcome: Adequate for Discharge   Problem: Coping: Goal: Level of anxiety will decrease Outcome: Adequate for Discharge   Problem: Elimination: Goal: Will not experience complications related to bowel motility Outcome: Adequate for Discharge Goal: Will not experience complications related to urinary retention Outcome: Adequate for Discharge   Problem: Pain Managment: Goal: General experience of comfort will improve and/or be controlled Outcome: Adequate for Discharge   Problem: Safety: Goal: Ability to remain free from injury will improve Outcome: Adequate for Discharge   Problem: Skin Integrity: Goal: Risk for impaired skin integrity will decrease Outcome: Adequate for Discharge   Problem: Education: Goal: Knowledge of the prescribed therapeutic regimen will improve Outcome: Adequate for Discharge   Problem: Bowel/Gastric: Goal: Gastrointestinal status for postoperative course will  improve Outcome: Adequate for Discharge   Problem: Cardiac: Goal: Ability to maintain an adequate cardiac output Outcome: Adequate for Discharge Goal: Will show no evidence of cardiac arrhythmias Outcome: Adequate for Discharge   Problem: Nutritional: Goal: Will attain and maintain optimal nutritional status Outcome: Adequate for Discharge   Problem: Neurological: Goal: Will regain or maintain usual level of consciousness Outcome: Adequate for Discharge   Problem: Clinical Measurements: Goal: Ability to maintain clinical measurements within normal limits Outcome: Adequate for Discharge Goal: Postoperative complications will be avoided or minimized Outcome: Adequate for Discharge   Problem: Respiratory: Goal: Will regain and/or maintain adequate ventilation Outcome: Adequate for Discharge Goal: Respiratory status will improve Outcome: Adequate for Discharge   Problem: Skin Integrity: Goal: Demonstrates signs of wound healing without infection Outcome: Adequate for Discharge   Problem: Urinary Elimination: Goal: Will remain free from infection Outcome: Adequate for Discharge Goal: Ability to achieve and maintain adequate urine output Outcome: Adequate for Discharge   Problem: Education: Goal: Knowledge of the prescribed therapeutic regimen will improve Outcome: Adequate for Discharge Goal: Understanding of sexual limitations or changes related to disease process or condition will improve Outcome: Adequate for Discharge Goal: Individualized Educational Video(s) Outcome: Adequate for Discharge   Problem: Self-Concept: Goal: Communication of feelings regarding changes in body function or appearance will improve Outcome: Adequate for Discharge   Problem: Skin Integrity: Goal: Demonstration of wound healing without infection will improve Outcome: Adequate for Discharge   Problem: Education: Goal: Knowledge of condition will improve Outcome: Adequate for  Discharge Goal: Individualized Educational Video(s) Outcome: Adequate for Discharge Goal: Individualized Newborn Educational Video(s) Outcome: Adequate for Discharge   Problem: Activity: Goal: Will verbalize the importance of balancing activity with adequate rest periods Outcome: Adequate for Discharge Goal: Ability to tolerate increased activity will improve Outcome: Adequate for Discharge   Problem: Coping: Goal: Ability to identify and utilize available resources and services will improve Outcome: Adequate for Discharge   Problem: Life Cycle: Goal: Chance of risk for  complications during the postpartum period will decrease Outcome: Adequate for Discharge   Problem: Role Relationship: Goal: Ability to demonstrate positive interaction with newborn will improve Outcome: Adequate for Discharge   Problem: Skin Integrity: Goal: Demonstration of wound healing without infection will improve Outcome: Adequate for Discharge   Problem: Education: Goal: Knowledge of disease or condition will improve Outcome: Adequate for Discharge Goal: Knowledge of the prescribed therapeutic regimen will improve Outcome: Adequate for Discharge   Problem: Fluid Volume: Goal: Peripheral tissue perfusion will improve Outcome: Adequate for Discharge   "

## 2024-10-15 NOTE — Lactation Note (Addendum)
 This note was copied from a baby's chart. Lactation Consultation Note  Patient Name: Charlene Mora Date: 10/15/2024 Age:31 hours  Reason for consult: Follow-up assessment;Breastfeeding assistance;Early term 37-38.6wks (sore nipples)  P4, [redacted]w[redacted]d, gained 20 g (3.75%), ETI  Mother had baby latched to her breast when I arrived to her room. He was attached well in an upright football hold on mother's left breast. She said he had been feeding for 10 minutes. Baby was not sucking and encouraged mother to do breast compression while baby was latched to entice baby to feed nutritive. He suckled without swallows and remained sleepy.   Grandmother fed baby a bottle and he was feeding well when I left the room.   Mother states she breast fed her other babies for a few months and struggled with low milk supply. She reports she has been pumping every 3 hrs while baby is bottle feeding. She is using her new Spectra  pump and the standard flange size is 24 mm. Mother needs 18-20 mm flanges. She was given information regarding flange inserts that she can purchase to fit inside her 24 mm flange. Discussed using flanges that fit appropriately to decrease nipple soreness and improved milk removal.   Mother reports her nipples are sore. Skin is intact. Coconut oil given to apply inside flange tunnel and her nipples to promote healing.   Discussed OP Lactation support and Mom made aware of O/P services, breastfeeding support groups, community resources, and phone # for post-discharge questions. Mother reports she is also getting lots of support with WIC.     Feeding Mother's Current Feeding Choice: Breast Milk and Formula Nipple Type: Other (bottle brought from home)  LATCH Score Latch: Repeated attempts needed to sustain latch, nipple held in mouth throughout feeding, stimulation needed to elicit sucking reflex.  Audible Swallowing: None  Type of Nipple: Everted at rest and after  stimulation  Comfort (Breast/Nipple): Soft / non-tender  Hold (Positioning): No assistance needed to correctly position infant at breast.  LATCH Score: 7   Lactation Tools Discussed/Used Tools:  (mother is pumping with her personal Spectra  DEBP) Flange Size:  (she has been using 24 mm flanges that came with her pump) Reason for Pumping: encouraged to keep pumping for stimulation until baby can feed well for the breast Pumping frequency: every 3 hrs for 15 min Pumped volume:  (few drops)  Interventions Interventions: Breast feeding basics reviewed;Education;Breast compression  Discharge Discharge Education: Engorgement and breast care;Warning signs for feeding baby;Outpatient recommendation  Consult Status Consult Status: Complete Date: 10/15/24    Joshua Rojelio HERO 10/15/2024, 11:36 AM

## 2024-10-15 NOTE — Procedures (Signed)
 Immediate Post-Partum Nexplanon  Insertion Procedure Note  Patient was identified.Risks and benefits of Nexplanon  reviewed.  Informed consent was signed, signed copy in chart. A time-out was performed.    The insertion site was identified 8-10 cm (3-4 inches) from the medial epicondyle of the humerus and 3-5 cm (1.25-2 inches) posterior to (below) the sulcus (groove) between the biceps and triceps muscles of the patient's left arm and marked. The site was prepped and draped in the usual sterile fashion. Pt was prepped with alcohol swab and then injected with 3 cc of 1% lidocaine . The site was prepped with betadine . Nexplanon  removed form packaging,  Device confirmed in needle, then inserted full length of needle and withdrawn per handbook instructions. Provider and patient verified presence of the implant in the womans arm by palpation. Pt insertion site was covered with steristrips/adhesive bandage and pressure bandage. There was minimal blood loss. Patient tolerated procedure well.  Patient was given post procedure instructions and Nexplanon  user card with expiration date. Condoms were recommended for STI prevention. Patient was asked to keep the pressure dressing on for 24 hours to minimize bruising and keep the adhesive bandage on for 3-5 days. The patient verbalized understanding of the plan of care and agrees.   Lot # [id number]  Expiration Date 04/27/2026

## 2024-10-16 ENCOUNTER — Ambulatory Visit

## 2024-10-17 DIAGNOSIS — Z0289 Encounter for other administrative examinations: Secondary | ICD-10-CM

## 2024-10-19 ENCOUNTER — Encounter (HOSPITAL_BASED_OUTPATIENT_CLINIC_OR_DEPARTMENT_OTHER): Payer: Self-pay

## 2024-10-19 ENCOUNTER — Ambulatory Visit (INDEPENDENT_AMBULATORY_CARE_PROVIDER_SITE_OTHER): Payer: Self-pay

## 2024-10-19 VITALS — BP 158/92 | HR 69 | Wt 296.0 lb

## 2024-10-19 DIAGNOSIS — O1003 Pre-existing essential hypertension complicating the puerperium: Secondary | ICD-10-CM | POA: Diagnosis not present

## 2024-10-19 DIAGNOSIS — Z4889 Encounter for other specified surgical aftercare: Secondary | ICD-10-CM

## 2024-10-19 DIAGNOSIS — O10019 Pre-existing essential hypertension complicating pregnancy, unspecified trimester: Secondary | ICD-10-CM

## 2024-10-19 NOTE — Progress Notes (Unsigned)
 Subjective:     Charlene Mora is a 29 y.o. female who presents to the clinic 1 week status post low uterine, transverse cesarean section. Pt reports incision is healing well.    HYPERTENSION ROS:  Pregnant/postpartum:  Severe headaches that don't go away with tylenol /other medicines: No  Visual changes (seeing spots/double/blurred vision) No  Severe pain under right breast breast or in center of upper chest No  Severe nausea/vomiting No  Taking medicines as instructed yes  Objective:    BP (!) 158/92 (BP Location: Left Arm, Patient Position: Sitting, Cuff Size: Large)   Pulse 69   Wt 296 lb (134.3 kg)   LMP 01/21/2024 (Exact Date)   SpO2 100%   Breastfeeding Yes   BMI 46.36 kg/m  General:  alert, well appearing, in no apparent distress  Incision:   healing well, no drainage, no erythema, no hernia, no seroma, no swelling, well approximated     Assessment:   Doing well postoperatively. Postpartum blood pressure check.   Plan:   Discussed with Dr.Miller. Recommendations: Continue to take BP medications as directed daily and check blood pressures at home. Report blood pressures to the office.  Wound care discussed. Follow-up: 1 week for BP recheck.

## 2024-10-25 ENCOUNTER — Ambulatory Visit (INDEPENDENT_AMBULATORY_CARE_PROVIDER_SITE_OTHER): Payer: Self-pay

## 2024-10-25 ENCOUNTER — Encounter (HOSPITAL_BASED_OUTPATIENT_CLINIC_OR_DEPARTMENT_OTHER): Payer: Self-pay

## 2024-10-25 VITALS — BP 137/79 | HR 75 | Wt 296.0 lb

## 2024-10-25 DIAGNOSIS — Z0130 Encounter for examination of blood pressure without abnormal findings: Secondary | ICD-10-CM | POA: Diagnosis not present

## 2024-10-25 DIAGNOSIS — O10019 Pre-existing essential hypertension complicating pregnancy, unspecified trimester: Secondary | ICD-10-CM

## 2024-10-25 NOTE — Progress Notes (Signed)
 NURSE VISIT- BLOOD PRESSURE CHECK  SUBJECTIVE:  Charlene Mora is a 29 y.o. [phone] female here for BP check. She is postpartum, delivery date 10/12/2024    HYPERTENSION ROS:  Pregnant/postpartum:  Severe headaches that don't go away with tylenol /other medicines: No  Visual changes (seeing spots/double/blurred vision) No  Severe pain under right breast breast or in center of upper chest No  Severe nausea/vomiting No  Taking medicines as instructed yes  OBJECTIVE:  BP 137/79 (BP Location: Left Arm, Patient Position: Sitting, Cuff Size: Large)   Pulse 75   Wt 296 lb (134.3 kg)   LMP 01/21/2024 (Exact Date)   SpO2 100%   Breastfeeding Yes   BMI 46.36 kg/m   Appearance alert, well appearing, and in no distress.  ASSESSMENT: Postpartum  blood pressure check  PLAN: Discussed with Nidia Daring, NP Recommendations: no changes needed   Follow-up: as scheduled

## 2024-11-06 ENCOUNTER — Ambulatory Visit (HOSPITAL_BASED_OUTPATIENT_CLINIC_OR_DEPARTMENT_OTHER): Admitting: Obstetrics and Gynecology
